# Patient Record
Sex: Female | Born: 1975 | Race: White | Hispanic: No | Smoking: Never smoker
Health system: Southern US, Community
[De-identification: ages and names within clinical notes are randomized; demographics above are authoritative.]

## PROBLEM LIST (undated history)

## (undated) DIAGNOSIS — I10 Essential (primary) hypertension: Secondary | ICD-10-CM

## (undated) DIAGNOSIS — I639 Cerebral infarction, unspecified: Secondary | ICD-10-CM

## (undated) DIAGNOSIS — F909 Attention-deficit hyperactivity disorder, unspecified type: Secondary | ICD-10-CM

## (undated) DIAGNOSIS — Z9889 Other specified postprocedural states: Secondary | ICD-10-CM

## (undated) DIAGNOSIS — N200 Calculus of kidney: Secondary | ICD-10-CM

## (undated) DIAGNOSIS — A419 Sepsis, unspecified organism: Secondary | ICD-10-CM

## (undated) DIAGNOSIS — E785 Hyperlipidemia, unspecified: Secondary | ICD-10-CM

## (undated) HISTORY — PX: SHOULDER SURGERY: SHX246

## (undated) HISTORY — DX: Cerebral infarction, unspecified: I63.9

## (undated) HISTORY — DX: Other specified postprocedural states: Z98.890

## (undated) HISTORY — DX: Sepsis, unspecified organism: A41.9

## (undated) HISTORY — PX: KNEE SURGERY: SHX244

## (undated) HISTORY — DX: Calculus of kidney: N20.0

## (undated) HISTORY — PX: LITHOTRIPSY: SUR834

## (undated) HISTORY — DX: Hyperlipidemia, unspecified: E78.5

## (undated) HISTORY — PX: OTHER SURGICAL HISTORY: SHX169

## (undated) HISTORY — DX: Essential (primary) hypertension: I10

## (undated) HISTORY — PX: HIP SURGERY: SHX245

## (undated) HISTORY — DX: Attention-deficit hyperactivity disorder, unspecified type: F90.9

## (undated) HISTORY — PX: CARDIAC SURGERY: SHX584

## (undated) HISTORY — PX: ANKLE SURGERY: SHX546

---

## 2012-03-26 ENCOUNTER — Emergency Department
Admission: EM | Admit: 2012-03-26 | Discharge: 2012-03-26 | Disposition: A | Discharge status: Home / Self Care | Payer: Managed Care, Other (non HMO) | Source: Home / Self Care | Attending: Family Medicine | Admitting: Family Medicine

## 2012-03-26 ENCOUNTER — Encounter: Payer: Self-pay | Admitting: *Deleted

## 2012-03-26 ENCOUNTER — Emergency Department (INDEPENDENT_AMBULATORY_CARE_PROVIDER_SITE_OTHER): Payer: Managed Care, Other (non HMO)

## 2012-03-26 DIAGNOSIS — R059 Cough, unspecified: Secondary | ICD-10-CM

## 2012-03-26 DIAGNOSIS — J111 Influenza due to unidentified influenza virus with other respiratory manifestations: Secondary | ICD-10-CM

## 2012-03-26 DIAGNOSIS — R05 Cough: Secondary | ICD-10-CM

## 2012-03-26 DIAGNOSIS — R509 Fever, unspecified: Secondary | ICD-10-CM

## 2012-03-26 IMAGING — CR DG CHEST 2V
2 series · 2 of 2 positions shown · non-contrast
Comparison: None.

CLINICAL DATA: Cough for 3 days, fever and chills

CHEST - 2 VIEW

[view not recorded (1 of 2)]
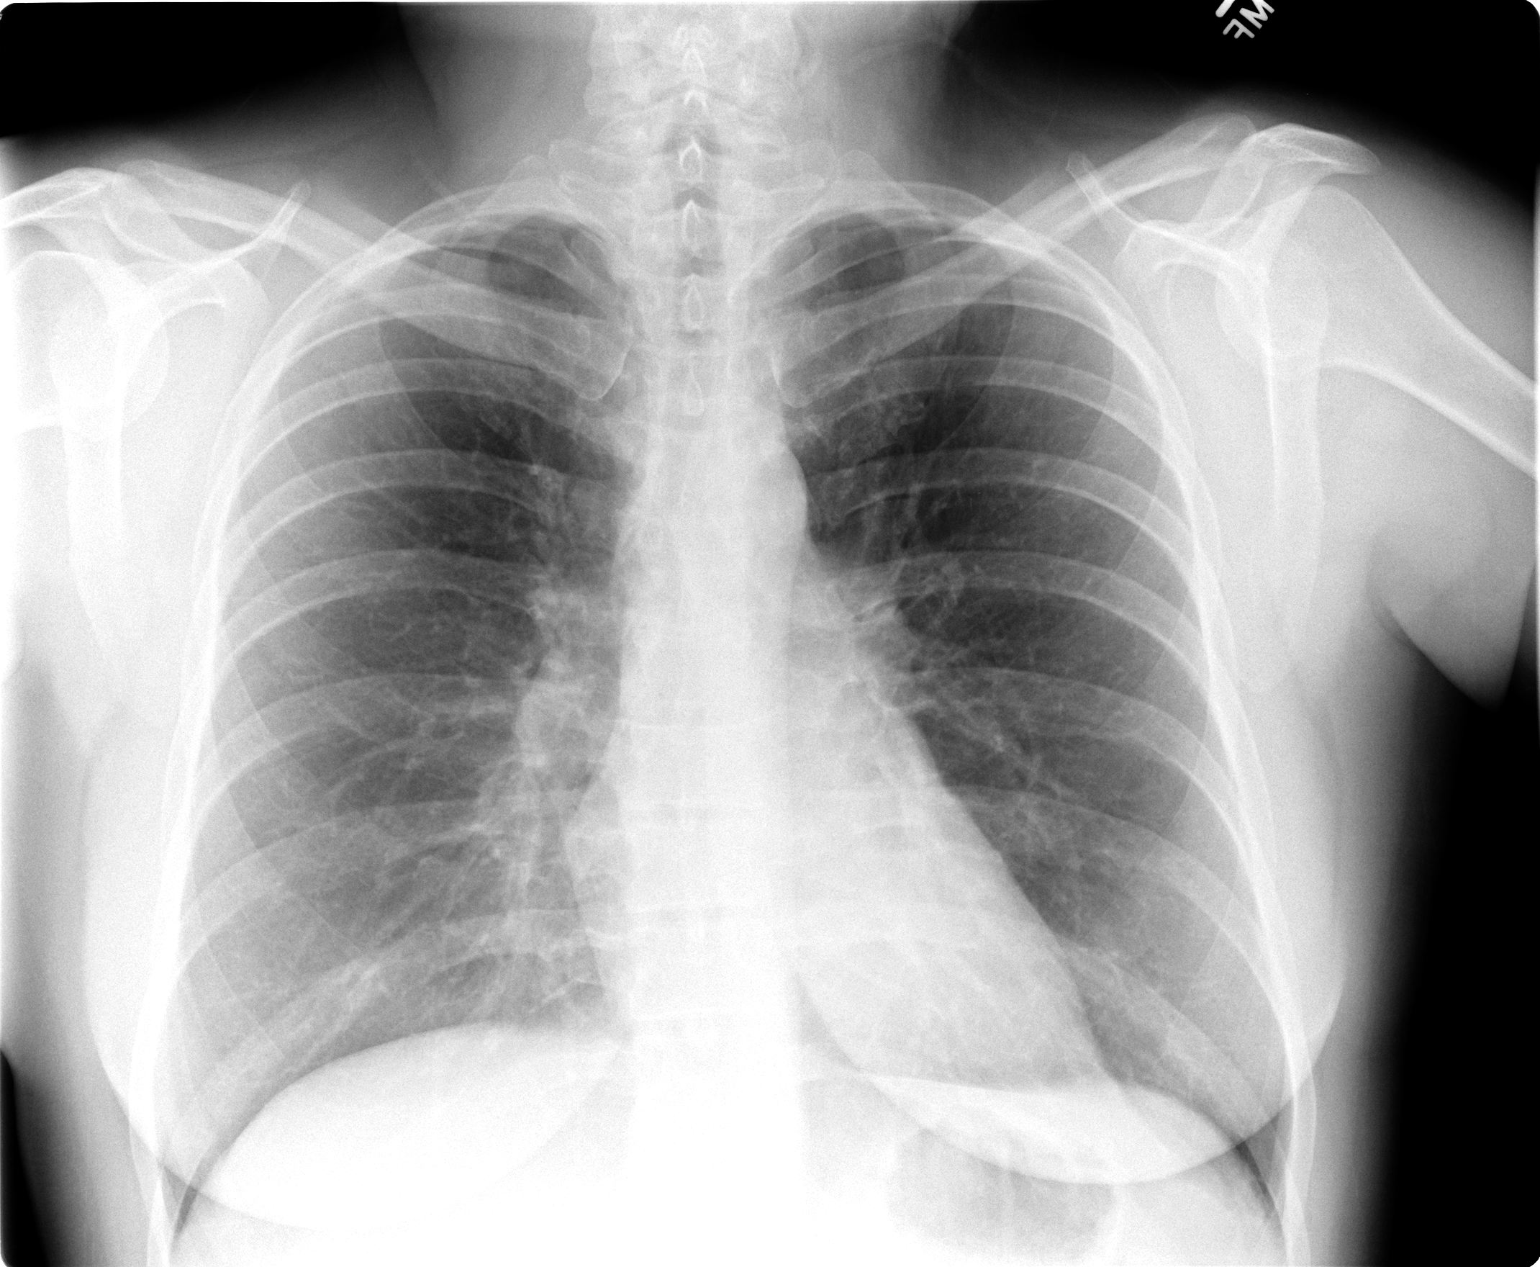

[view not recorded (2 of 2)]
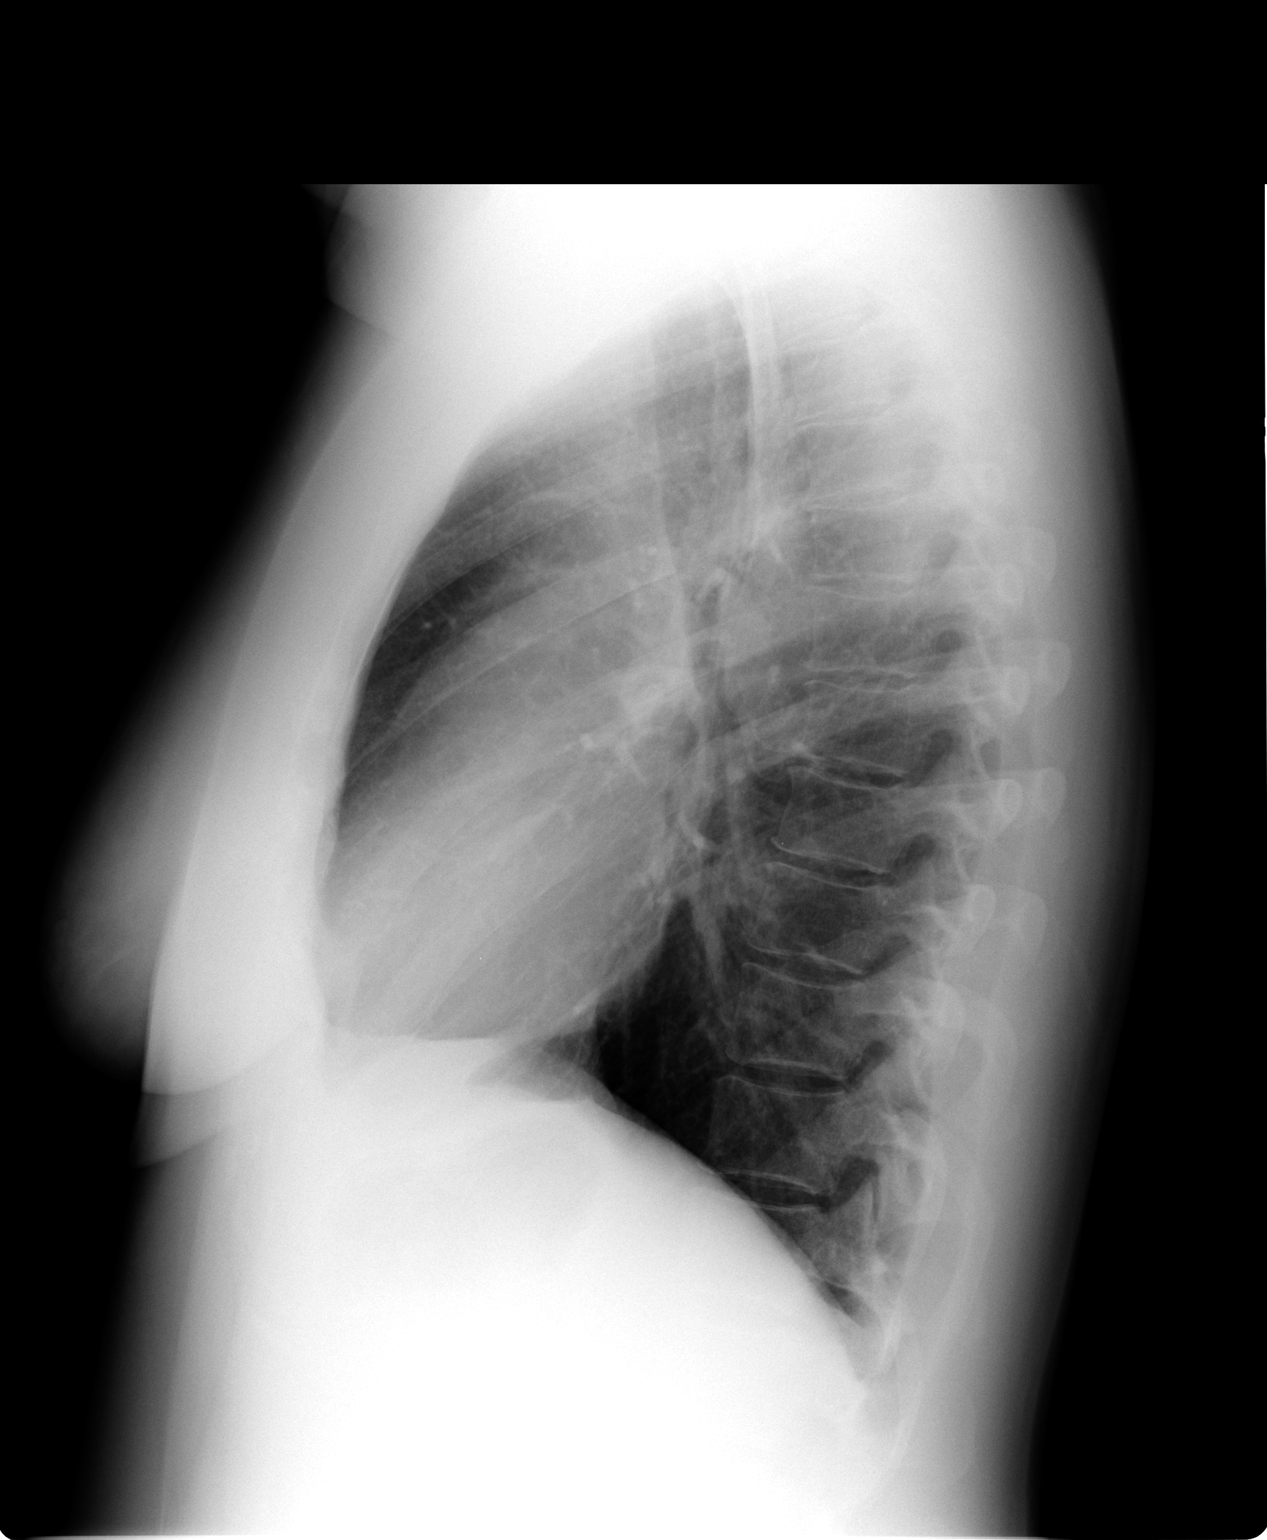

[2 of 2 positions shown; findings below may reference images not displayed]

FINDINGS: Normal cardiac silhouette and mediastinal contours.  No
focal parenchymal opacities.  No pleural effusion or pneumothorax.
No acute osseous abnormalities.
IMPRESSION: No acute cardiopulmonary disease.  Specifically, no evidence of
pneumonia.

## 2012-03-26 MED ORDER — CEFDINIR 300 MG PO CAPS: 300.0000 mg | ORAL_CAPSULE | Freq: Two times a day (BID) | ORAL | Status: DC

## 2012-03-26 MED ORDER — HYDROCOD POLST-CHLORPHEN POLST 10-8 MG/5ML PO LQCR: 5.0000 mL | Freq: Every evening | ORAL | Status: DC | PRN

## 2012-03-26 NOTE — ED Notes (Signed)
Pt c/o cough, fever, SOB x 4 days. Has tried OTC Ibuprofen and Delsym with no relief. Pt states she went to ED on Saturday night and was evaluated had CT scan, blood work, flu test and spinal tap.

## 2012-03-26 NOTE — ED Provider Notes (Signed)
History     CSN: [id number]  Arrival date & time 03/26/12  1859   First MD Initiated Contact with Patient 03/26/12 1918      Chief Complaint  Patient presents with  . Cough  . Fever  . Chills      HPI Comments: Patient states that she awoke four days ago with a headache and sinus congestion.  Later she developed body aches, chills/sweats, and tightness in her anterior chest.  She developed a cough about two days ago.  Her headache became so severe that she presented to the [facility] ER where CT head,  lumbar puncture, and flu test were negative.  She continues to have low grade fever, cough with wheezing/shortness of breath, and fatigue. She has had pneumonia three times in the past.  She has a history of exercise induced asthma.  The history is provided by the patient.    History reviewed. No pertinent past medical history.  History reviewed. No pertinent past surgical history.  Family History  Problem Relation Age of Onset  . Cancer Mother   . Stroke Father     History  Substance Use Topics  . Smoking status: Never Smoker   . Smokeless tobacco: Never Used  . Alcohol Use: No    OB History    Grav Para Term Preterm Abortions TAB SAB Ect Mult Living                  Review of Systems No sore throat + cough No pleuritic pain but has tightness in anterior chest + wheezing + nasal congestion ? post-nasal drainage No sinus pain/pressure No itchy/red eyes No earache No hemoptysis + SOB + fever, + chills No nausea No vomiting No abdominal pain + diarrhea No urinary symptoms No skin rashes + fatigue + myalgias + headache Used OTC meds without relief  Allergies  Sulfa antibiotics  Home Medications   Current Outpatient Rx  Name  Route  Sig  Dispense  Refill  . CEFDINIR 300 MG PO CAPS   Oral   Take 1 capsule (300 mg total) by mouth 2 (two) times daily.   14 capsule   0   . HYDROCOD POLST-CPM POLST ER 10-8 MG/5ML PO LQCR   Oral   Take 5  mLs by mouth at bedtime as needed.   115 mL   0     BP 117/81  Pulse 73  Temp 98.5 F (36.9 C) (Oral)  Resp 20  Ht 5\' 9"  (1.753 m)  Wt 177 lb 4 oz (80.4 kg)  BMI 26.18 kg/m2  SpO2 99%  Physical Exam Nursing notes and Vital Signs reviewed. Appearance:  Patient appears healthy, stated age, and in no acute distress Eyes:  Pupils are equal, round, and reactive to light and accomodation.  Extraocular movement is intact.  Conjunctivae are not inflamed  Ears:  Canals normal.  Tympanic membranes normal.  Nose:  Mildly congested turbinates.  No sinus tenderness.  Pharynx:  Normal Neck:  Supple.  Tender shotty posterior nodes are palpated bilaterally  Lungs:  Clear to auscultation.  Breath sounds are equal. Chest:  Distinct tenderness to palpation over the mid-sternum.   Heart:  Regular rate and rhythm without murmurs, rubs, or gallops.  Abdomen:  Nontender without masses or hepatosplenomegaly.  Bowel sounds are present.  No CVA or flank tenderness.  Extremities:  No edema.  No calf tenderness Skin:  No rash present.   ED Course  Procedures  none  Dg Chest 2 View  03/26/2012  *RADIOLOGY REPORT*  Clinical Data: Cough for 3 days, fever and chills  CHEST - 2 VIEW  Comparison: None.  Findings: Normal cardiac silhouette and mediastinal contours.  No focal parenchymal opacities.  No pleural effusion or pneumothorax. No acute osseous abnormalities.  IMPRESSION: No acute cardiopulmonary disease.  Specifically, no evidence of pneumonia.   Original Report Authenticated By: Tacey Ruiz, MD      1. Influenza-like illness       MDM  With a history of several episodes of pneumonia, will begin empiric Omnicef.  Tussionex for night-time cough. Take Mucinex D (guaifenesin with decongestant) twice daily for congestion.  Increase fluid intake, rest. May use Afrin nasal spray (or generic oxymetazoline) twice daily for about 5 days.  Also recommend using saline nasal spray several times daily and  saline nasal irrigation (AYR is a common brand) Stop all antihistamines for now, and other non-prescription cough/cold preparations. May take Ibuprofen 200mg , 4 tabs every 8 hours with food for body aches, headache, fever, etc. Recommend a flu shot when well.  Follow-up with family doctor if not improving 7 to 10 days.         Lattie Haw, MD 03/26/12 2027

## 2014-12-22 DIAGNOSIS — M797 Fibromyalgia: Secondary | ICD-10-CM | POA: Insufficient documentation

## 2015-05-12 DIAGNOSIS — F419 Anxiety disorder, unspecified: Secondary | ICD-10-CM | POA: Diagnosis present

## 2016-05-27 DIAGNOSIS — I639 Cerebral infarction, unspecified: Secondary | ICD-10-CM | POA: Insufficient documentation

## 2016-07-27 DIAGNOSIS — E785 Hyperlipidemia, unspecified: Secondary | ICD-10-CM | POA: Insufficient documentation

## 2019-06-07 DIAGNOSIS — F909 Attention-deficit hyperactivity disorder, unspecified type: Secondary | ICD-10-CM | POA: Insufficient documentation

## 2020-06-01 ENCOUNTER — Other Ambulatory Visit: Payer: Self-pay | Admitting: Family Medicine

## 2020-06-01 DIAGNOSIS — M4807 Spinal stenosis, lumbosacral region: Secondary | ICD-10-CM

## 2020-06-01 DIAGNOSIS — G8929 Other chronic pain: Secondary | ICD-10-CM

## 2020-06-01 DIAGNOSIS — M25552 Pain in left hip: Secondary | ICD-10-CM

## 2020-06-20 ENCOUNTER — Ambulatory Visit
Admission: RE | Admit: 2020-06-20 | Discharge: 2020-06-20 | Disposition: A | Discharge status: Home / Self Care | Payer: Managed Care, Other (non HMO) | Source: Ambulatory Visit | Attending: Family Medicine | Admitting: Family Medicine

## 2020-06-20 ENCOUNTER — Other Ambulatory Visit: Payer: Self-pay

## 2020-06-20 DIAGNOSIS — M25552 Pain in left hip: Secondary | ICD-10-CM

## 2020-06-20 DIAGNOSIS — G8929 Other chronic pain: Secondary | ICD-10-CM

## 2020-06-20 DIAGNOSIS — M4807 Spinal stenosis, lumbosacral region: Secondary | ICD-10-CM

## 2020-06-20 IMAGING — MR MR HIP*L* W/O CM
5 of 6 series · 29 of 40 positions shown · non-contrast
Comparison: None.

CLINICAL DATA: Chronic left hip pain

EXAM:
MR OF THE LEFT HIP WITHOUT CONTRAST
TECHNIQUE: Multiplanar, multisequence MR imaging was performed. No intravenous
contrast was administered.

[Series 4: T2 fat-sat · coronal · 4.0mm · 0.74mm/px · 6 of 24 slices shown (1 of 3)]
[im 1/24]
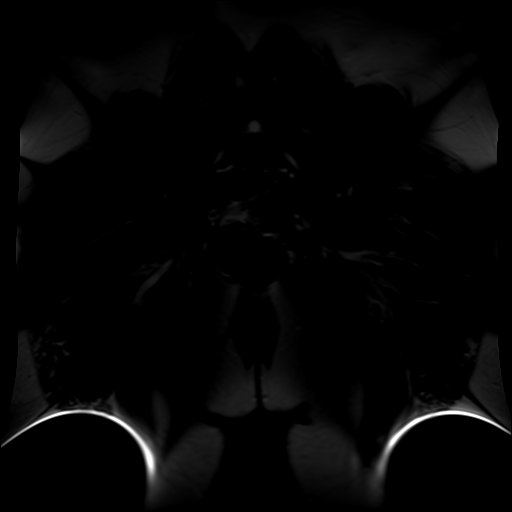
[im 5/24]
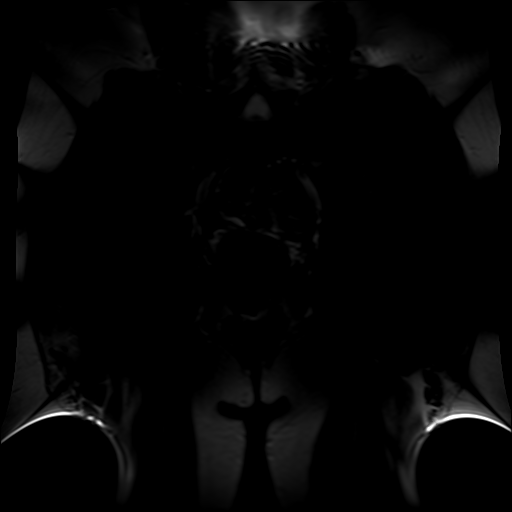
[im 10/24]
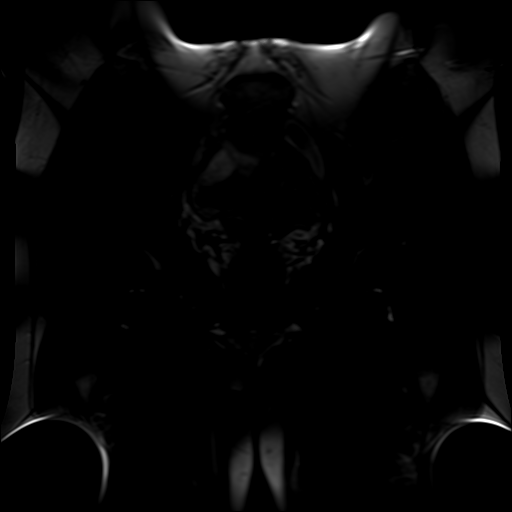
[im 14/24]
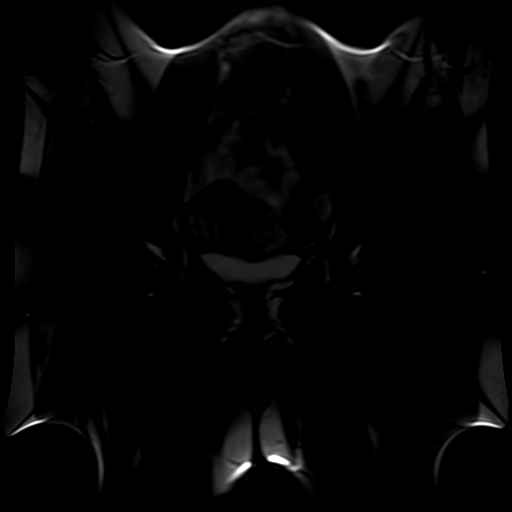
[im 19/24]
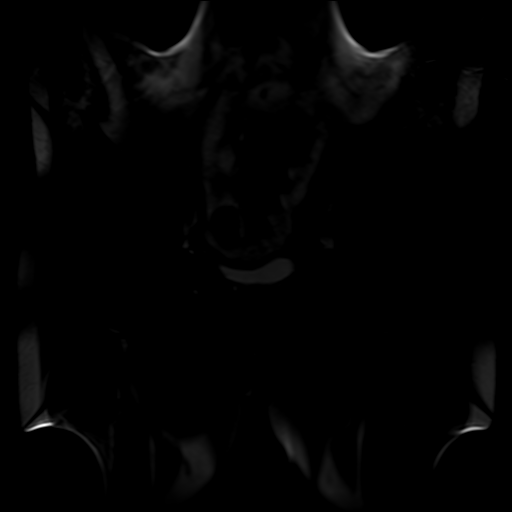
[im 24/24]
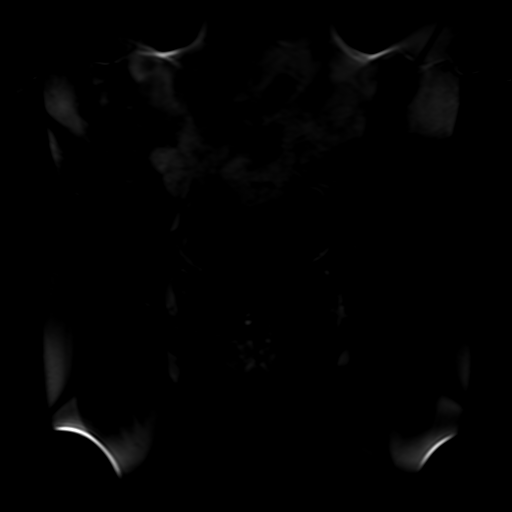

[Series 5: T2 fat-sat · axial · 4.0mm · 0.70mm/px · z∈[-144,+16]mm · 9 of 33 slices shown (2 of 3)]
[im 1/33]
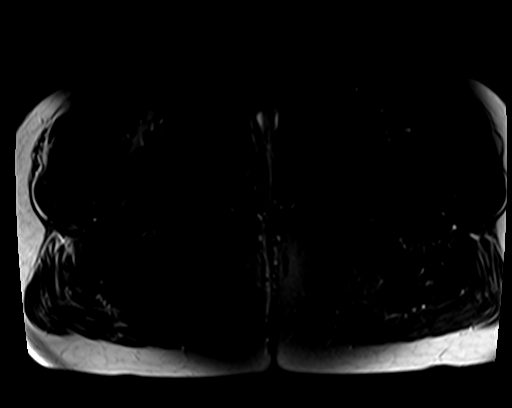
[im 5/33]
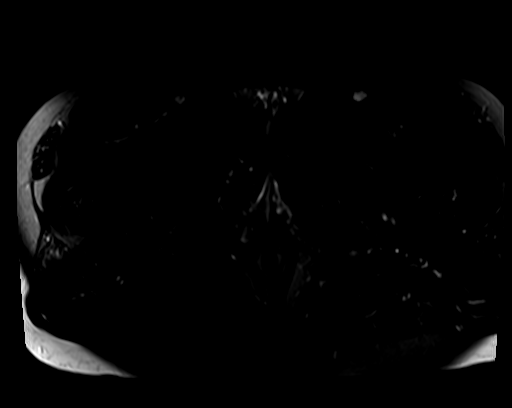
[im 9/33]
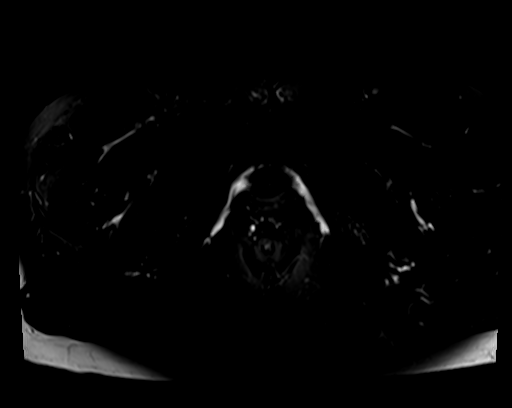
[im 13/33]
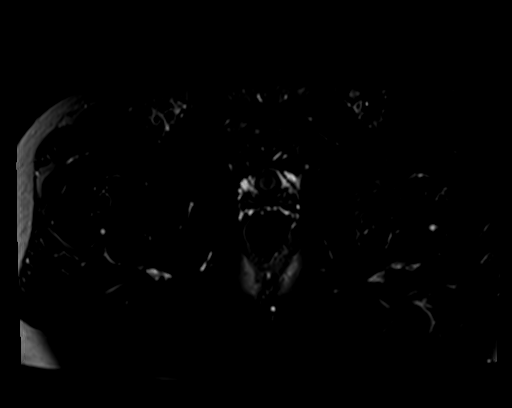
[im 17/33]
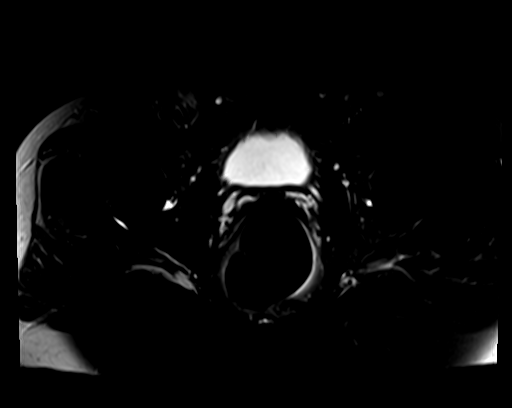
[im 21/33]
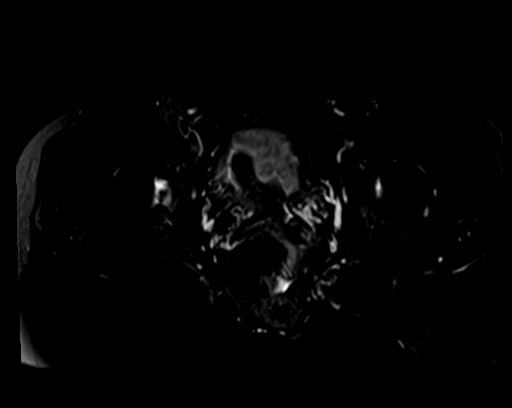
[im 25/33]
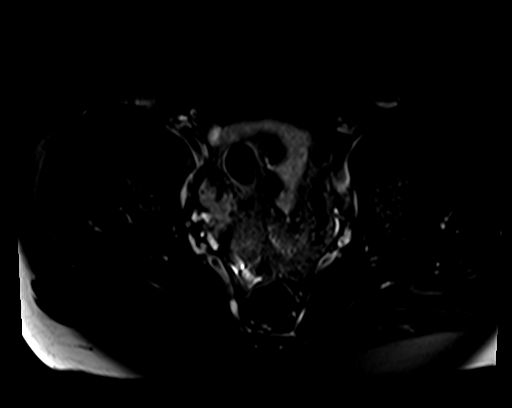
[im 29/33]
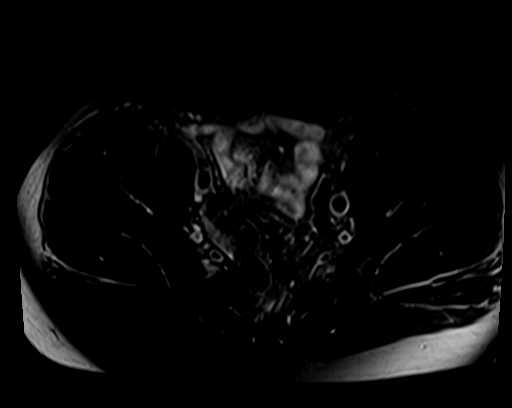
[im 33/33]
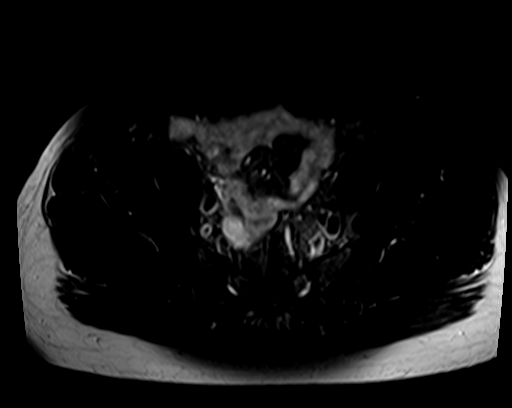

[Series 6: PD fat-sat · sagittal · 4.0mm · 0.70mm/px · 6 of 22 slices shown (1 of 2)]
[im 1/22]
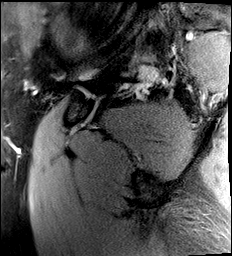
[im 5/22]
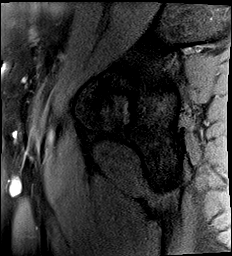
[im 9/22]
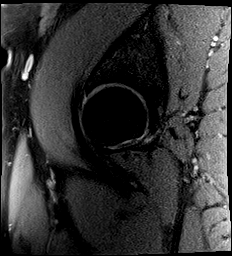
[im 13/22]
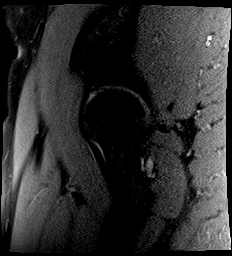
[im 17/22]
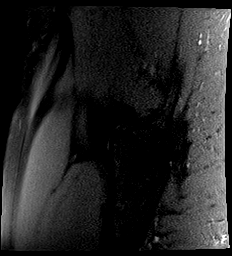
[im 22/22]
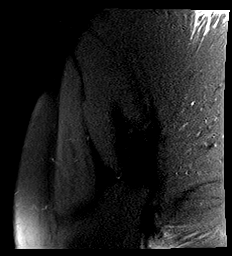

[Series 7: PD fat-sat · coronal · 4.0mm · 0.70mm/px · 5 of 19 slices shown (2 of 2)]
[im 1/19]
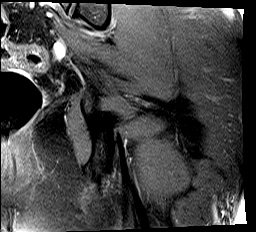
[im 5/19]
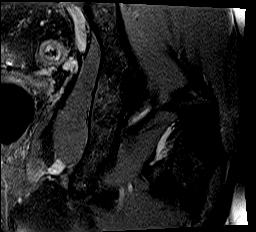
[im 10/19]
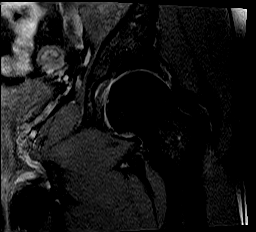
[im 14/19]
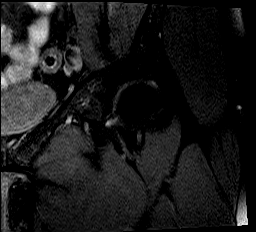
[im 19/19]
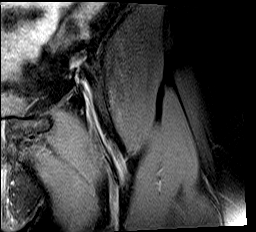

[Series 8: T2 fat-sat · axial · 4.0mm · 0.35mm/px · z∈[-133,-93]mm · 3 of 27 slices shown (3 of 3)]
[im 1/27]
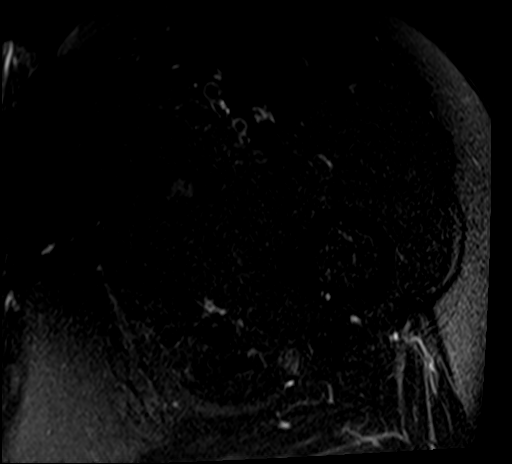
[im 5/27]
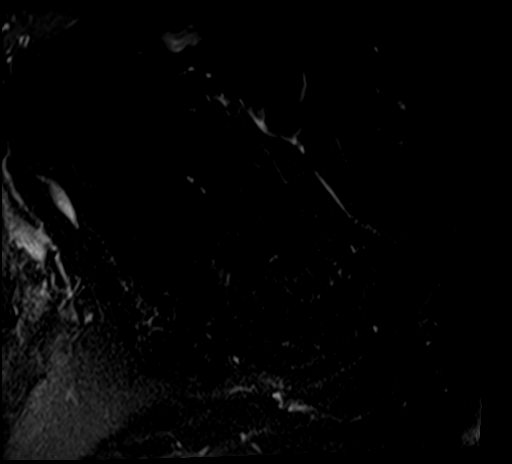
[im 9/27]
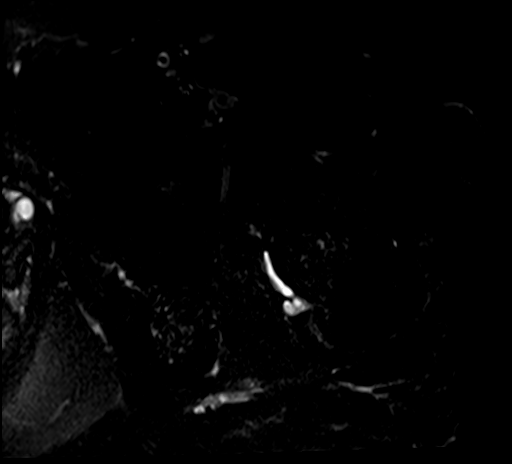

[29 of 40 positions shown; findings below may reference images not displayed]

FINDINGS: Bones: No acute fracture. No dislocation. No femoral head avascular
necrosis. Pelvic bony ring intact. Minimal degenerative changes of
the bilateral sacroiliac joints. No evidence of sacroiliitis. No SI
joint effusion. Minimal degenerative changes of the pubic symphysis.
No marrow edema. No suspicious marrow replacing lesion.

Articular cartilage and labrum

Articular cartilage: Mild chondral thinning along the superior
aspect of the left hip joint. No subchondral signal changes.

Labrum: Degenerative tearing of the superior labrum. No paralabral
cyst.

Joint or bursal effusion

Joint effusion:  None.

Bursae: No abnormal bursal fluid collection.

Muscles and tendons

Muscles and tendons: The gluteal, hamstring, iliopsoas, rectus
femoris, and adductor tendons appear intact without tear or
significant tendinosis. Normal muscle bulk and signal intensity
without edema, atrophy, or fatty infiltration.

Other findings

Miscellaneous: No soft tissue edema or fluid collection. No inguinal
lymphadenopathy. No acute findings are seen within the pelvis.
IMPRESSION: 1. Mild left hip osteoarthritis with degenerative tearing of the
superior labrum.
2. No acute osseous abnormality.

## 2020-06-20 IMAGING — MR MR LUMBAR SPINE W/O CM
4 of 5 series · 26 of 48 positions shown · non-contrast
Comparison: None.

CLINICAL DATA: Low back pain radiating to the left hip

EXAM:
MRI LUMBAR SPINE WITHOUT CONTRAST
TECHNIQUE: Multiplanar, multisequence MR imaging of the lumbar spine was
performed. No intravenous contrast was administered.

[Series 3: T2 · sagittal · 4.0mm · 0.53mm/px · 6 of 16 slices shown (1 of 2)]
[im 1/16]
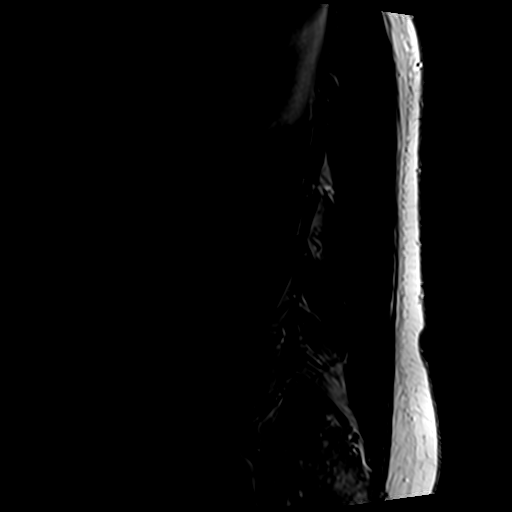
[im 4/16]
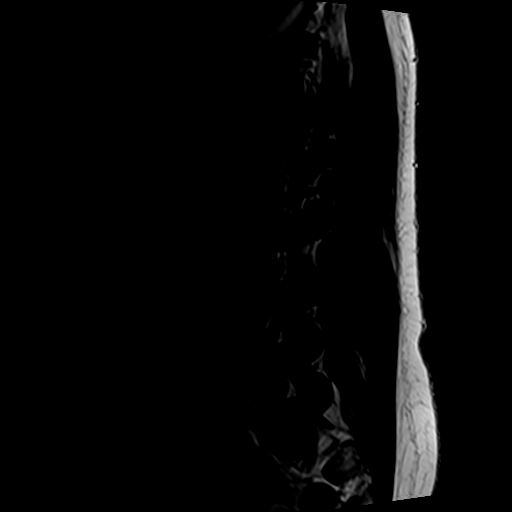
[im 7/16]
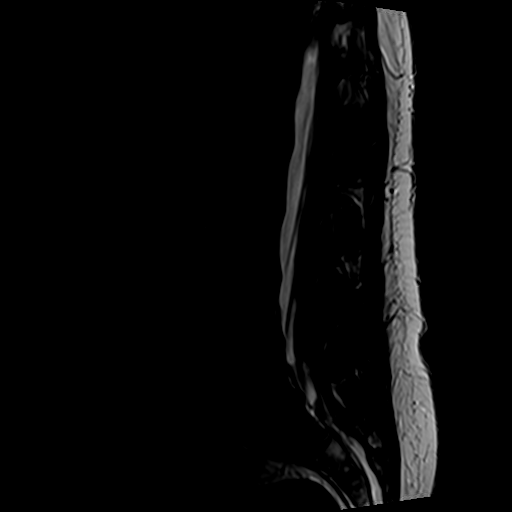
[im 10/16]
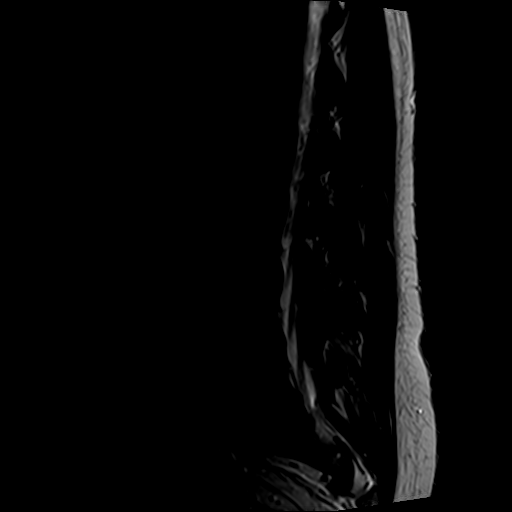
[im 13/16]
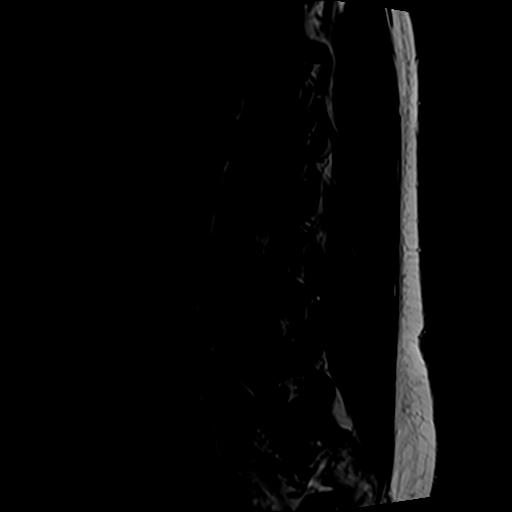
[im 16/16]
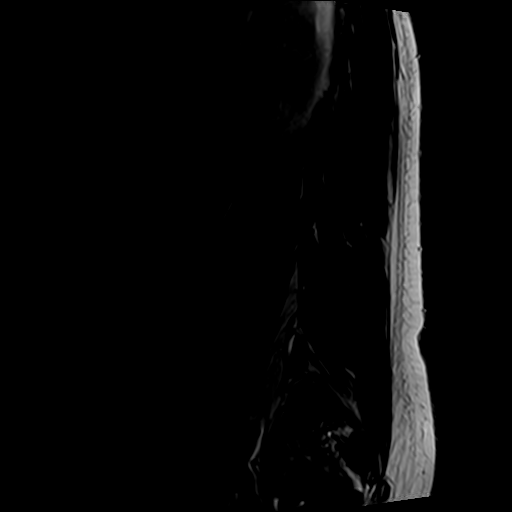

[Series 5: T1 · sagittal · 4.0mm · 0.53mm/px · 6 of 16 slices shown (1 of 2)]
[im 1/16]
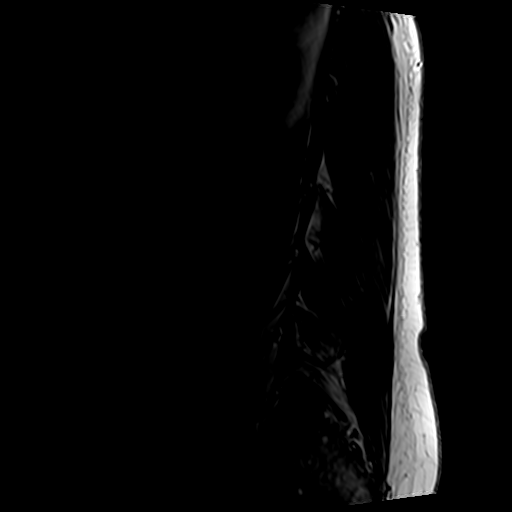
[im 4/16]
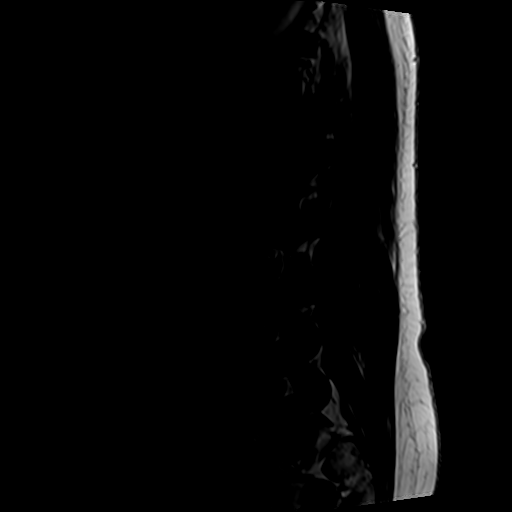
[im 7/16]
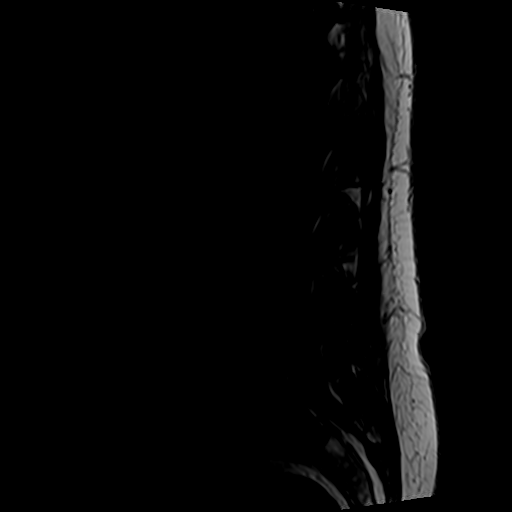
[im 10/16]
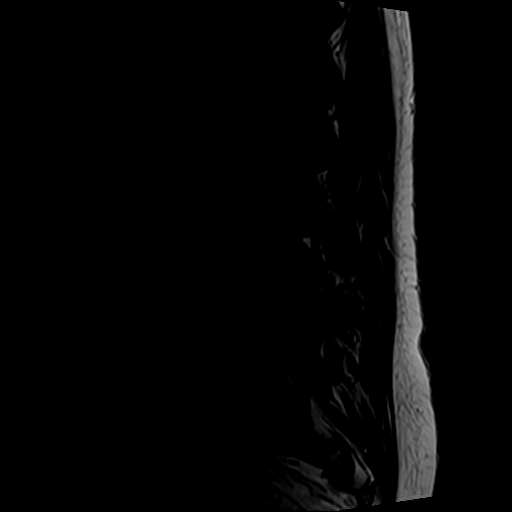
[im 13/16]
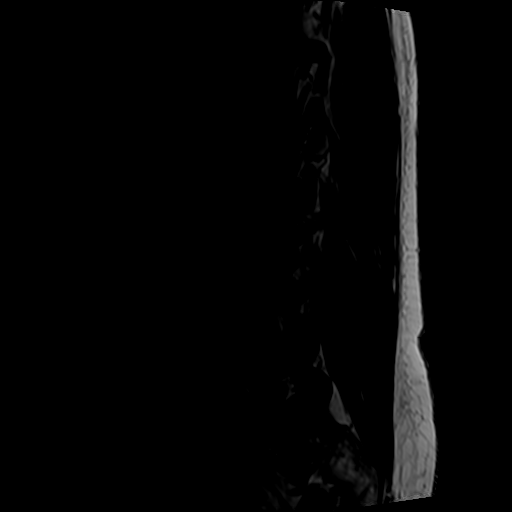
[im 16/16]
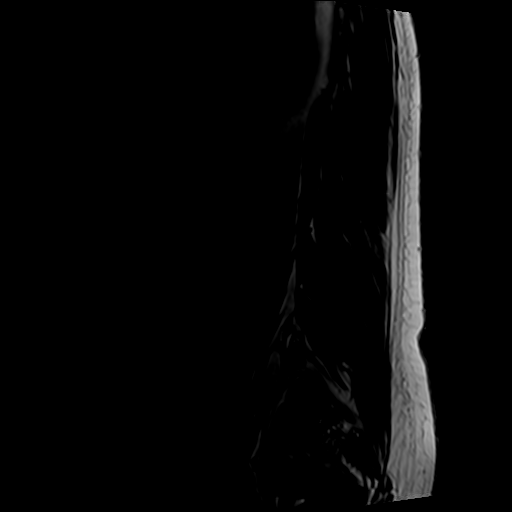

[Series 6: T2 · axial · 4.0mm · 0.70mm/px · z∈[-147,+71]mm · 9 of 40 slices shown (2 of 2)]
[im 1/40]
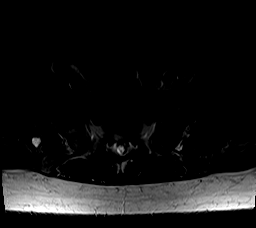
[im 6/40]
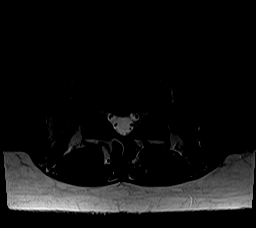
[im 12/40]
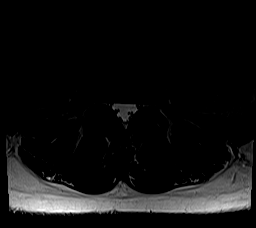
[im 17/40]
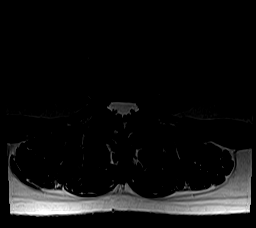
[im 20/40]
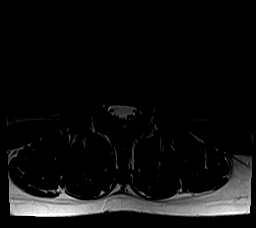
[im 23/40]
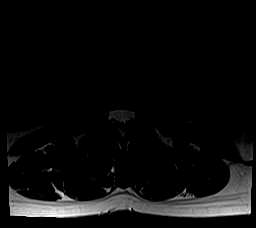
[im 28/40]
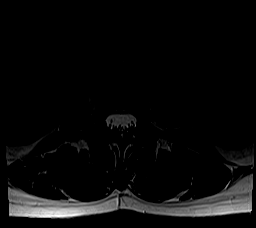
[im 34/40]
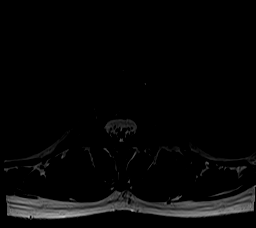
[im 40/40]
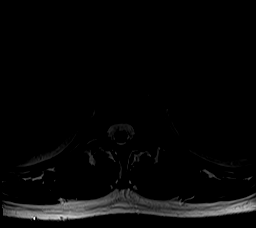

[Series 7: T1 · axial · 4.0mm · 0.35mm/px · z∈[-147,+39]mm · 5 of 40 slices shown (2 of 2)]
[im 1/40]
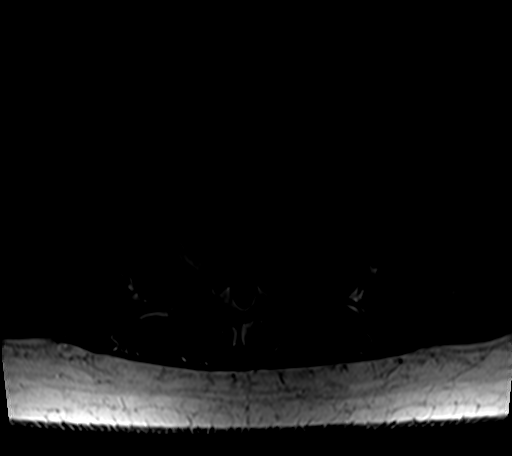
[im 6/40]
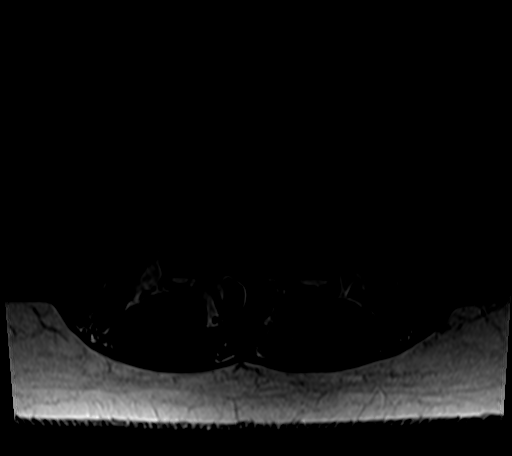
[im 12/40]
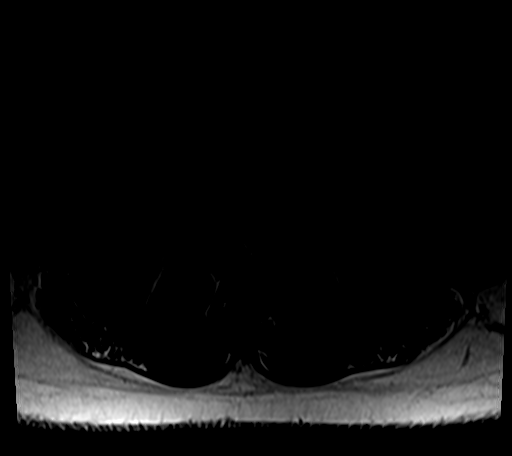
[im 20/40]
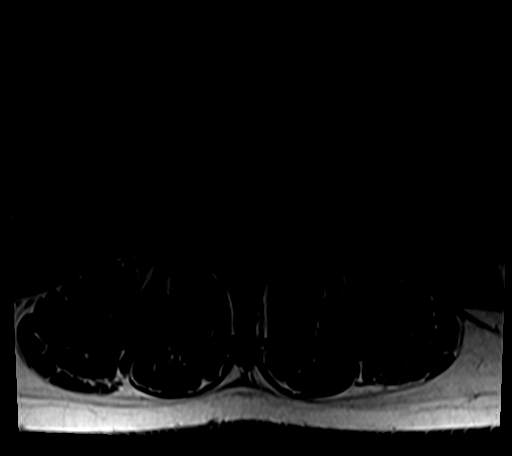
[im 34/40]
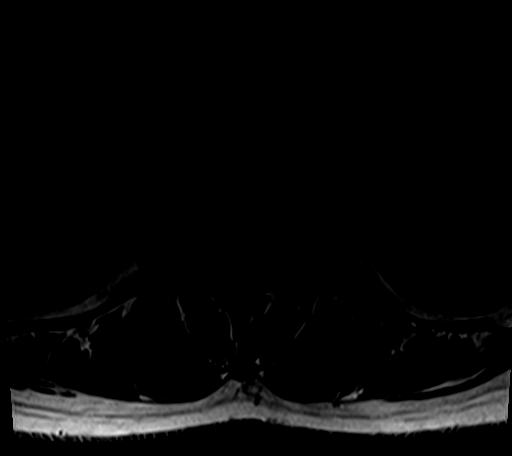

[26 of 48 positions shown; findings below may reference images not displayed]

FINDINGS: Segmentation:  Standard.

Alignment:  Physiologic.

Vertebrae:  No fracture, evidence of discitis, or bone lesion.

Conus medullaris and cauda equina: Conus extends to the L1 level.
Conus and cauda equina appear normal.

Paraspinal and other soft tissues: Negative.

Disc levels:

T12-L1: No significant disc protrusion, foraminal stenosis, or canal
stenosis.

L1-L2: No significant disc protrusion, foraminal stenosis, or canal
stenosis.

L2-L3: No significant disc protrusion, foraminal stenosis, or canal
stenosis.

L3-L4: No significant disc protrusion, foraminal stenosis, or canal
stenosis.

L4-L5: No significant disc protrusion, foraminal stenosis, or canal
stenosis.

L5-S1: Disc desiccation with disc height loss and small right
paracentral disc protrusion. Posterior annular fissure. No foraminal
or canal stenosis.
IMPRESSION: Mild degenerative disc disease at L5-S1 without foraminal or canal
stenosis.

## 2021-05-31 ENCOUNTER — Other Ambulatory Visit: Payer: Self-pay | Admitting: Family Medicine

## 2021-05-31 DIAGNOSIS — I10 Essential (primary) hypertension: Secondary | ICD-10-CM

## 2021-05-31 DIAGNOSIS — I701 Atherosclerosis of renal artery: Secondary | ICD-10-CM

## 2021-05-31 DIAGNOSIS — R0609 Other forms of dyspnea: Secondary | ICD-10-CM

## 2021-05-31 NOTE — Progress Notes (Deleted)
{  Select_TRH_Note:[id number]} 

## 2021-06-03 ENCOUNTER — Ambulatory Visit (HOSPITAL_COMMUNITY): Admission: RE | Admit: 2021-06-03 | Payer: 59 | Source: Ambulatory Visit

## 2021-06-09 ENCOUNTER — Other Ambulatory Visit: Payer: Self-pay

## 2021-06-09 ENCOUNTER — Ambulatory Visit
Admission: RE | Admit: 2021-06-09 | Discharge: 2021-06-09 | Disposition: A | Discharge status: Home / Self Care | Payer: 59 | Source: Ambulatory Visit | Attending: Family Medicine | Admitting: Family Medicine

## 2021-06-09 DIAGNOSIS — I10 Essential (primary) hypertension: Secondary | ICD-10-CM

## 2021-06-09 DIAGNOSIS — I701 Atherosclerosis of renal artery: Secondary | ICD-10-CM

## 2021-06-09 IMAGING — US US RENAL ARTERY STENOSIS
1 series · 13 of 25 positions shown · non-contrast
Comparison: None.

CLINICAL DATA: Uncontrolled hypertension. Evaluate for renal artery
stenosis.

EXAM:
RENAL/URINARY TRACT ULTRASOUND
RENAL DUPLEX DOPPLER ULTRASOUND

[Series 1: us renal artery stenosis · 0.26mm/px · 13 of 106 slices shown]
[im 1/106]
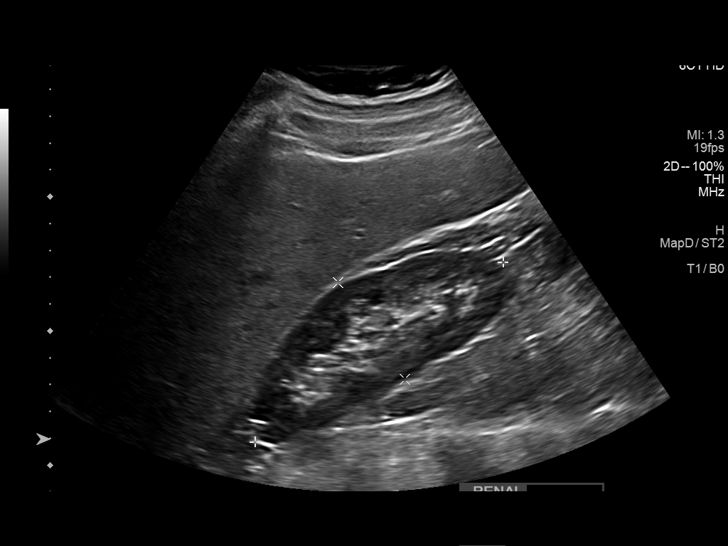
[im 9/106]
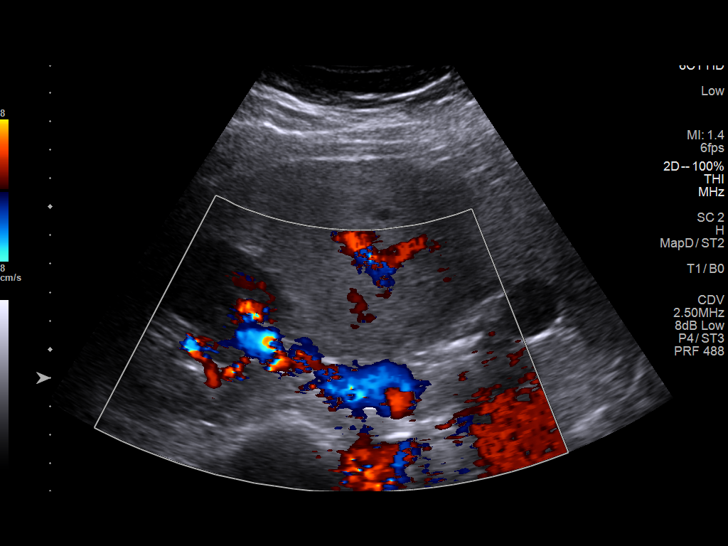
[im 18/106]
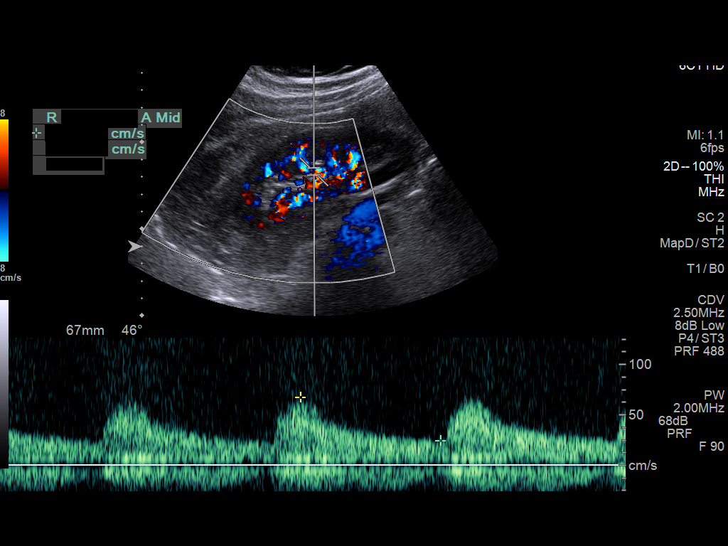
[im 27/106]
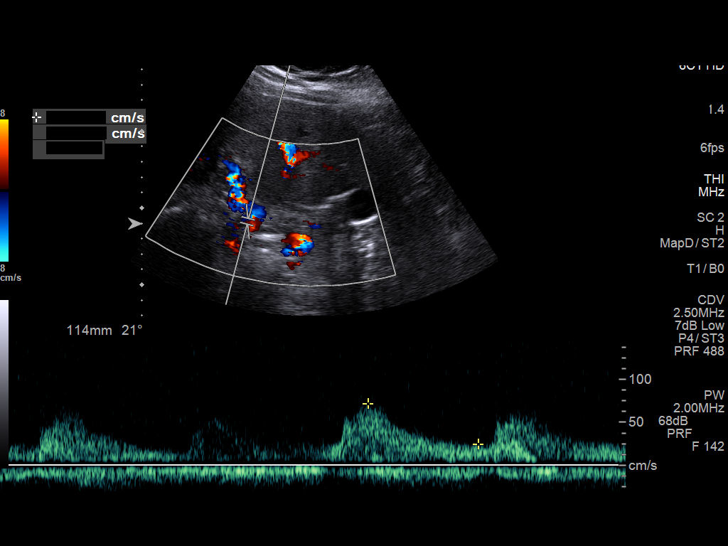
[im 36/106]
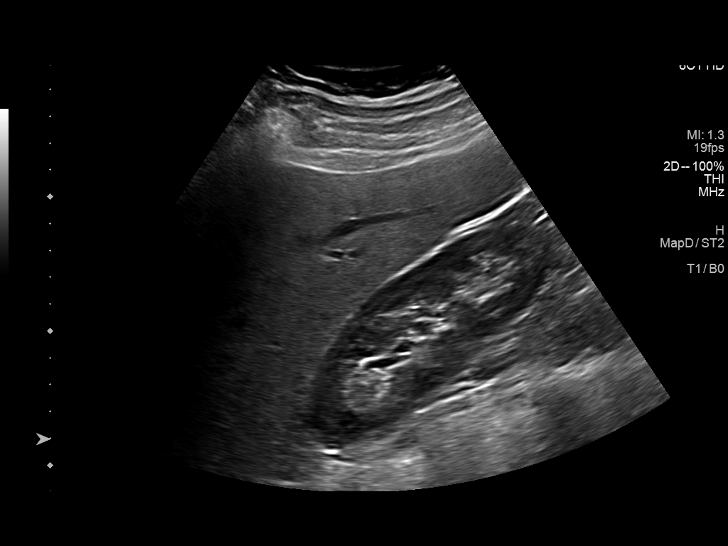
[im 44/106]
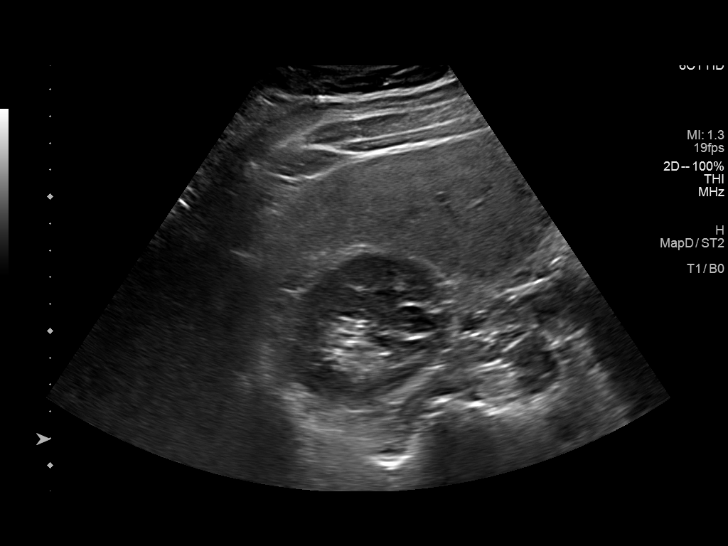
[im 53/106]
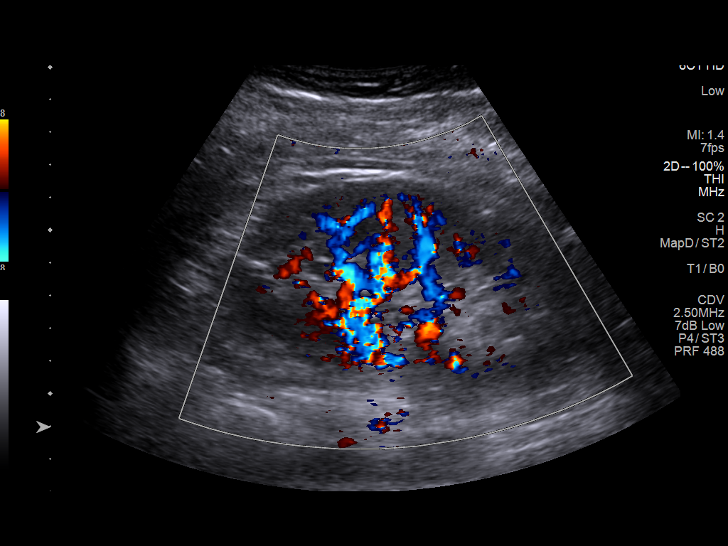
[im 62/106]
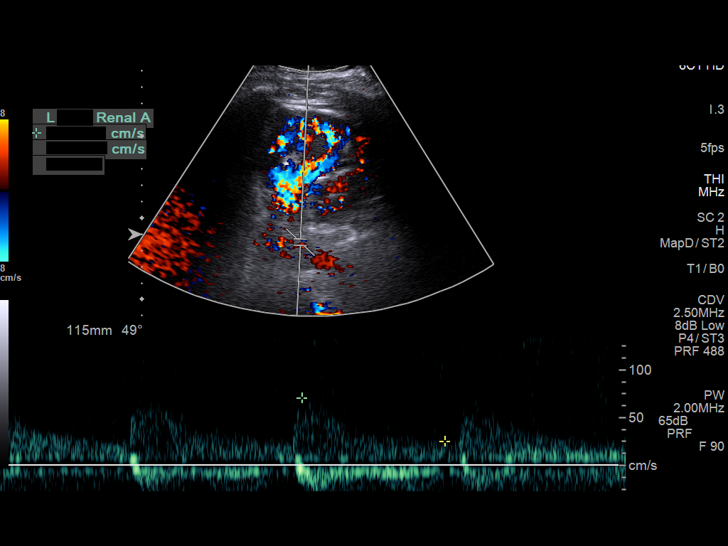
[im 71/106]
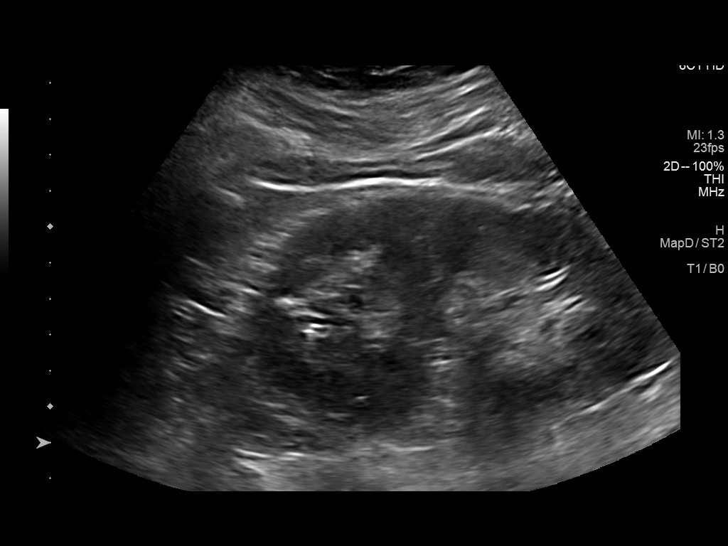
[im 79/106]
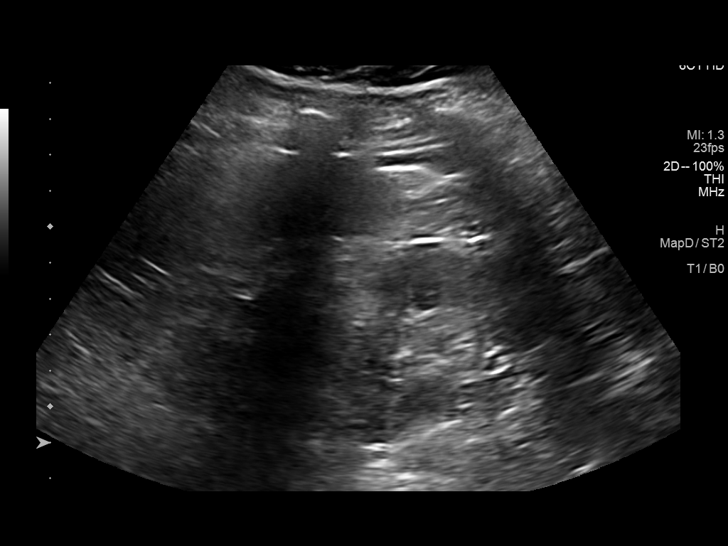
[im 88/106]
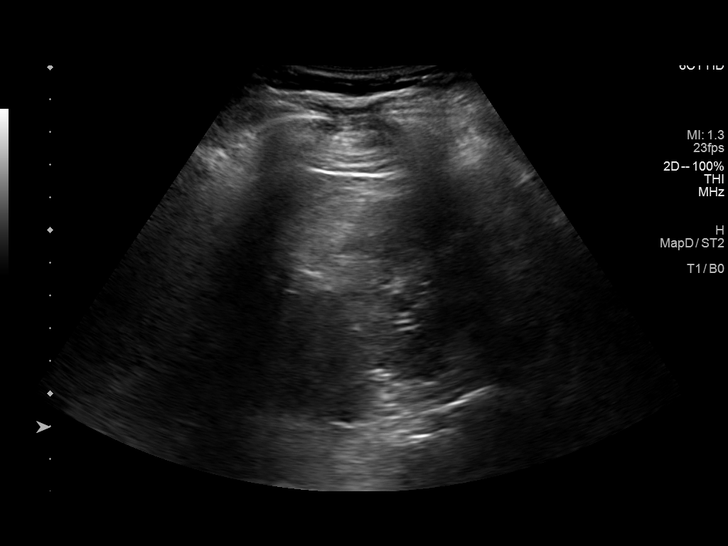
[im 97/106]
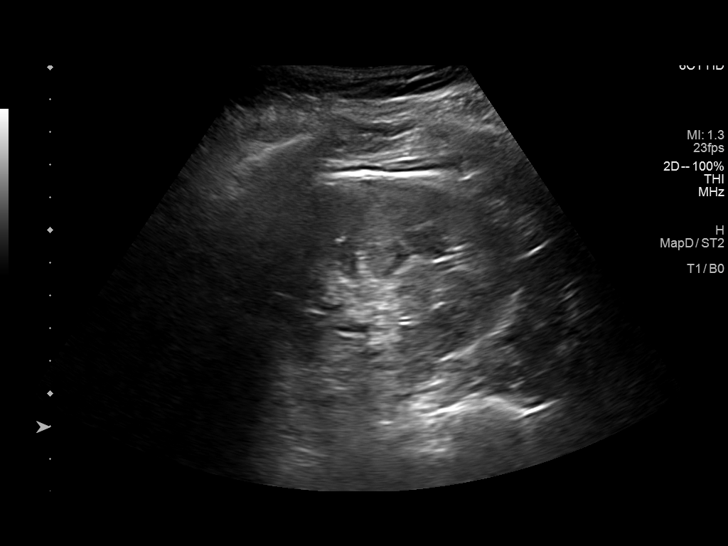
[im 106/106]
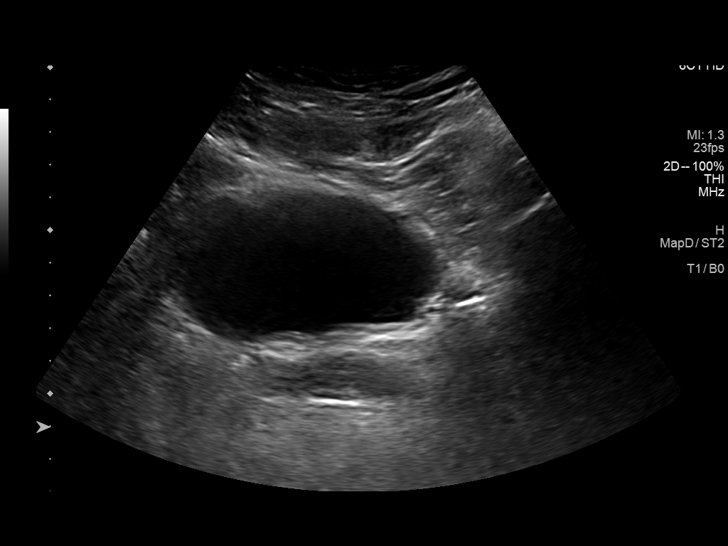

[13 of 25 positions shown; findings below may reference images not displayed]

FINDINGS: Right Kidney:

Normal cortical thickness, echogenicity and size, measuring 11.4 cm
in length. No focal renal lesions. No echogenic renal stones. No
urinary obstruction.

Left Kidney:

Normal cortical thickness, echogenicity and size, measuring 12.5 cm
in length. Questionable approximately 2.2 x 2.0 x 1.6 cm
hypo/isoechoic lesion involving the superior pole the left kidney
(images 74 and 81). No echogenic renal stones. No urinary
obstruction.

Bladder:  Appears normal given degree of distention.

_________________________________________________________

RENAL DUPLEX ULTRASOUND

Right Renal Artery Velocities: Note is made of 2 right-sided renal
arteries

Origin:  108 cm/sec  / 119 cm/sec

Mid:  351 cm/sec / 35 cm/sec

Hilum:  303 cm/sec / 94 cm/sec

Interlobar:  88 cm/sec

Arcuate:  33 cm/sec

Left Renal Artery Velocities:

Origin:  71 cm/sec

Mid:  139 cm/sec

Hilum:  134 cm/sec

Interlobar:  70 cm/sec

Arcuate:  31 cm/sec

Aortic Velocity:  97 cm/sec

Right Renal-Aortic Ratios:

Origin: 1.1 /

Mid: 3.6 /

Hilum: 3.1 /

Interlobar:

Arcuate:

Left Renal-Aortic Ratios:

Origin:

Mid:

Hilum:

Interlobar:

Arcuate:

Elevated peak systolic velocities and abnormal renal artery/aortic
ratios are demonstrated in one of the duplicated right renal
arteries.

Normal velocities and low resistance waveforms demonstrated
throughout the additional duplicated right renal artery as well as
the left renal artery. The bilateral renal veins appear patent.

Normal caliber the abdominal aorta.

There is diffuse increased echogenicity of the hepatic parenchyma
with potential mild nodularity of the hepatic contour
(representative image 39), findings suggestive of hepatic steatosis.
IMPRESSION: 1. Suspected hemodynamically significant narrowing involving one of
the duplicated right renal arteries, however this finding is without
associated asymmetric renal atrophy. Further evaluation with CTA of
the abdomen pelvis could be performed as indicated.
2. Indeterminate 2.2 cm right-sided renal lesion/pseudolesion.
Again, this could be further evaluated with abdominal CT as
indicated.
3. Suspected hepatic steatosis.  Correlation with LFTs is advised.

## 2021-06-30 ENCOUNTER — Other Ambulatory Visit: Payer: Self-pay | Admitting: Family Medicine

## 2021-06-30 DIAGNOSIS — I701 Atherosclerosis of renal artery: Secondary | ICD-10-CM

## 2021-06-30 DIAGNOSIS — I150 Renovascular hypertension: Secondary | ICD-10-CM

## 2021-06-30 NOTE — Progress Notes (Unsigned)
t{Select_TRH_Note:26780} ?

## 2021-07-06 ENCOUNTER — Ambulatory Visit (HOSPITAL_COMMUNITY)
Admission: RE | Admit: 2021-07-06 | Discharge: 2021-07-06 | Disposition: A | Discharge status: Home / Self Care | Payer: 59 | Source: Ambulatory Visit | Attending: Family Medicine | Admitting: Family Medicine

## 2021-07-06 DIAGNOSIS — I160 Hypertensive urgency: Secondary | ICD-10-CM | POA: Diagnosis not present

## 2021-07-06 DIAGNOSIS — I517 Cardiomegaly: Secondary | ICD-10-CM | POA: Diagnosis not present

## 2021-07-06 DIAGNOSIS — R0609 Other forms of dyspnea: Secondary | ICD-10-CM | POA: Insufficient documentation

## 2021-07-06 DIAGNOSIS — I7121 Aneurysm of the ascending aorta, without rupture: Secondary | ICD-10-CM | POA: Diagnosis not present

## 2021-07-06 LAB — ECHOCARDIOGRAM COMPLETE
Area-P 1/2: 2.39 cm2
Calc EF: 60.6 %
S' Lateral: 2.6 cm
Single Plane A2C EF: 67.8 %
Single Plane A4C EF: 57.1 %

## 2021-07-07 ENCOUNTER — Ambulatory Visit
Admission: RE | Admit: 2021-07-07 | Discharge: 2021-07-07 | Disposition: A | Discharge status: Home / Self Care | Payer: 59 | Source: Ambulatory Visit | Attending: Family Medicine | Admitting: Family Medicine

## 2021-07-07 DIAGNOSIS — I701 Atherosclerosis of renal artery: Secondary | ICD-10-CM

## 2021-07-07 DIAGNOSIS — I150 Renovascular hypertension: Secondary | ICD-10-CM

## 2021-07-07 IMAGING — CT CT CTA ABD/PEL W/CM AND/OR W/O CM
2 of 10 series · 13 of 46 positions shown, 17 images · IV contrast (agent unspecified)
Comparison: Ultrasound [DATE]

CLINICAL DATA: Abnormal renal artery Doppler ultrasound.
Unexplained hypertension, right-sided abdominal pain, nausea

EXAM:
CTA ABDOMEN AND PELVIS WITHOUT AND WITH CONTRAST
TECHNIQUE: Multidetector CT imaging of the abdomen and pelvis was performed
using the standard protocol during bolus administration of
intravenous contrast. Multiplanar reconstructed images and MIPs were
obtained and reviewed to evaluate the vascular anatomy.

[Series 9: cta arterial 2.00 bv36 s3 renal cor art · coronal · arterial · 0.56mm/px · 2 of 142 slices shown]
[im 48/142  soft-tissue]
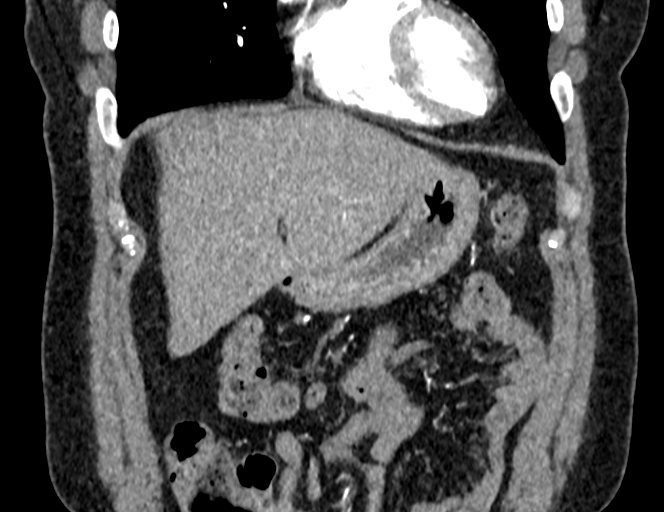
[im 95/142  soft-tissue]
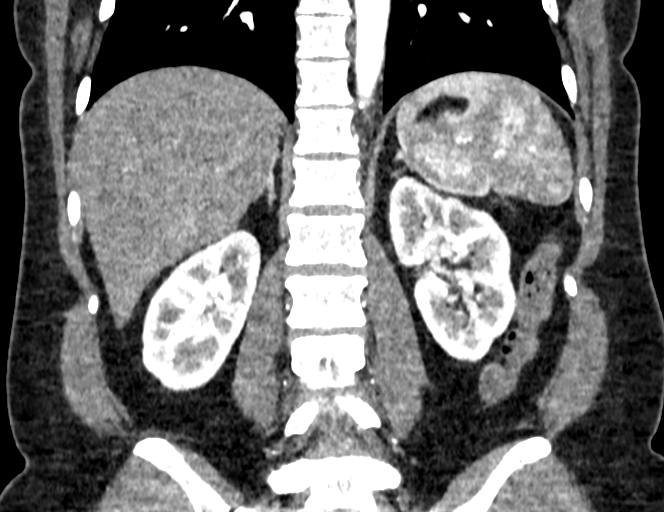

[Series 17: cta renal venous 1.00 bv36 s3 thins · axial · portal-venous · 0.83mm/px · z∈[+1264,+1673]mm · 11 of 487 slices shown, 15 images]
[im 52/487  soft-tissue]
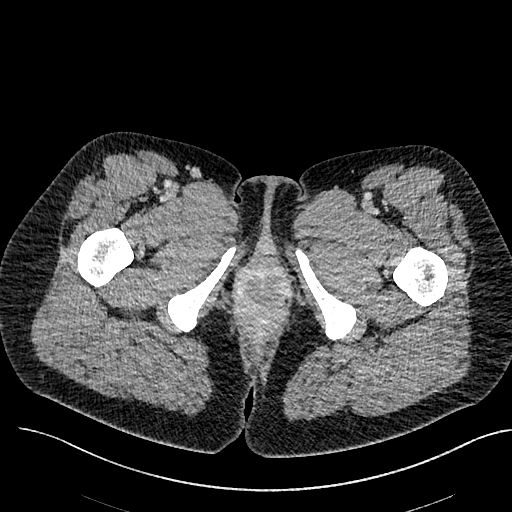
[im 52/487  bone]
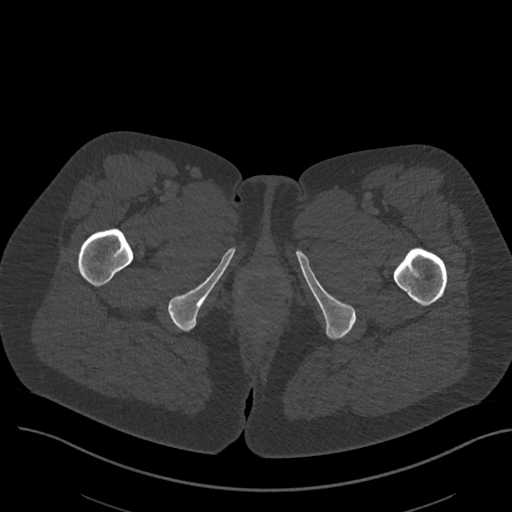
[im 103/487  soft-tissue]
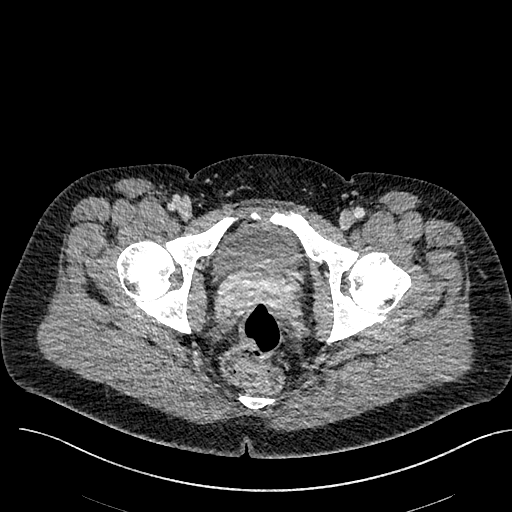
[im 154/487  soft-tissue]
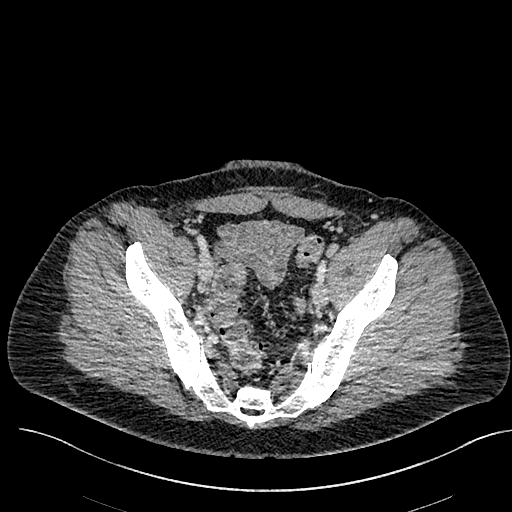
[im 205/487  soft-tissue]
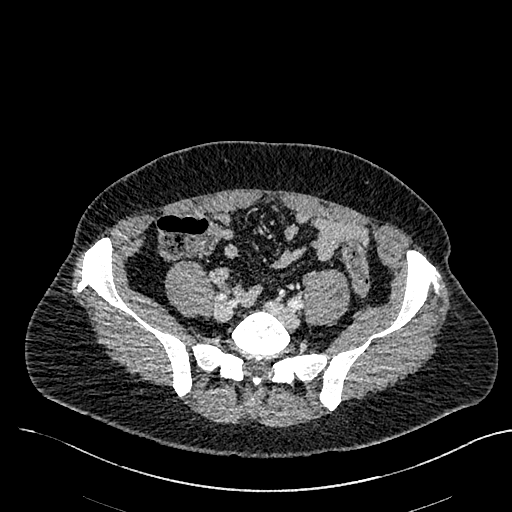
[im 256/487  soft-tissue]
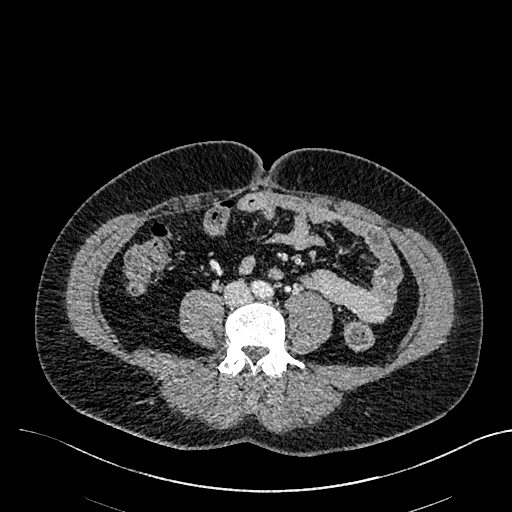
[im 307/487  soft-tissue]
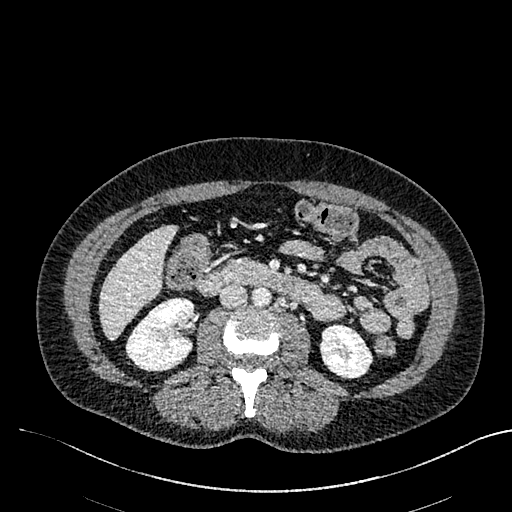
[im 359/487  soft-tissue]
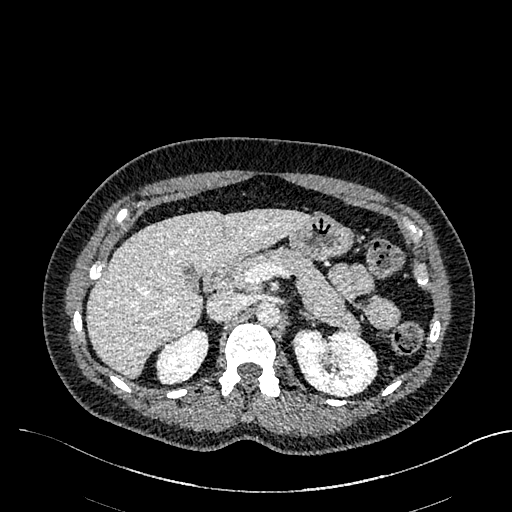
[im 384/487  lung]
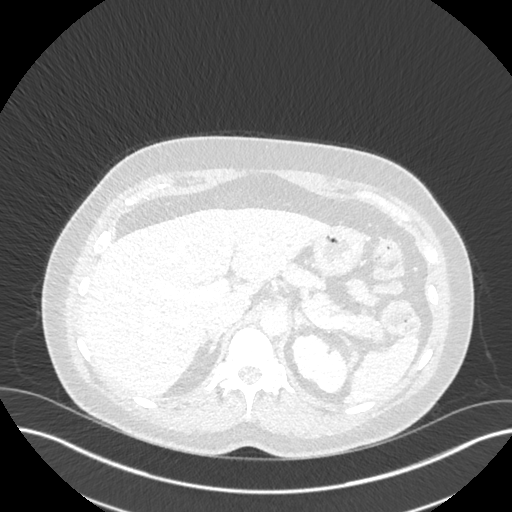
[im 410/487  soft-tissue]
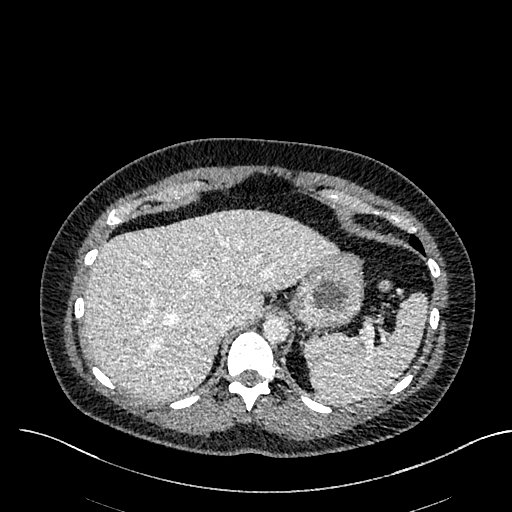
[im 410/487  lung]
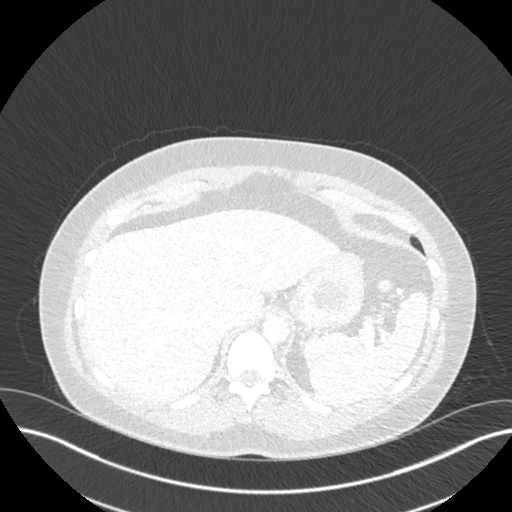
[im 435/487  lung]
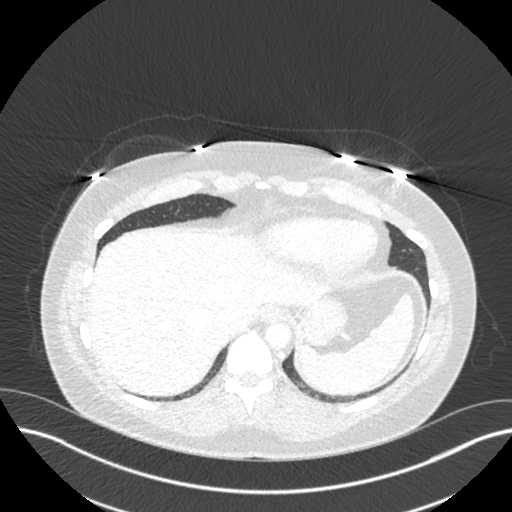
[im 461/487  soft-tissue]
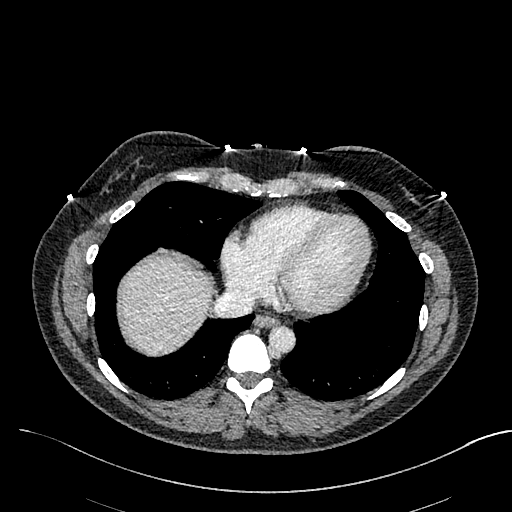
[im 461/487  lung]
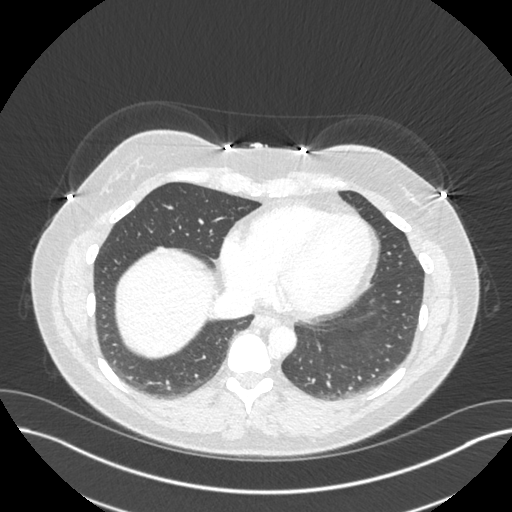
[im 461/487  bone]
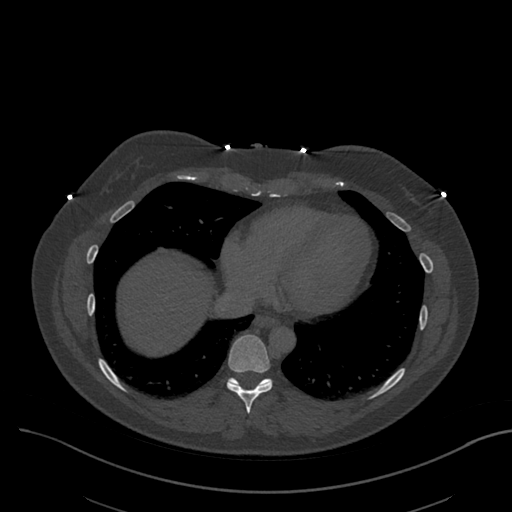

[13 of 46 positions shown; findings below may reference images not displayed]

RADIATION DOSE REDUCTION: This exam was performed according to the
departmental dose-optimization program which includes automated
exposure control, adjustment of the mA and/or kV according to
patient size and/or use of iterative reconstruction technique.

CONTRAST:  75mL [EU] IOPAMIDOL ([EU]) INJECTION 76%
FINDINGS: VASCULAR

Aorta: Minimal calcified plaque in the infrarenal segment. No
aneurysm, dissection, or stenosis.

Celiac: Patent without evidence of aneurysm, dissection, vasculitis
or significant stenosis.

SMA: Patent without evidence of aneurysm, dissection, vasculitis or
significant stenosis. Replaced right hepatic arterial supply, an
anatomic variant.

Renals: Single right, widely patent proximally. There is a subtle
beaded appearance of the distal main right renal artery extending
into the posterior division, with suggestion of thin intraluminal
awebs, seen best on the coronal reconstructions.

Single left, widely patent proximally, also with a subtle beaded
appearance and thin intraluminal webs suggested particularly on the
coronal reconstructions in the mid and distal part of the main renal
artery; distal branches unremarkable.

IMA: Patent without evidence of aneurysm, dissection, vasculitis or
significant stenosis.

Inflow: Bilateral common iliac arteries unremarkable.

Proximal Outflow: Unremarkable, included only on the venous phase.

Veins: Patent hepatic veins, portal vein, bilateral renal veins. No
venous pathology evident.

Review of the MIP images confirms the above findings.

NON-VASCULAR

Lower chest: ASD occluder device partially visualized. No pleural or
pericardial effusion.

Hepatobiliary: No focal liver abnormality is seen. No gallstones,
gallbladder wall thickening, or biliary dilatation.

Pancreas: Unremarkable. No pancreatic ductal dilatation or
surrounding inflammatory changes.

Spleen: Normal in size without focal abnormality.

Adrenals/Urinary Tract: Adrenal glands are unremarkable. Kidneys are
normal, without renal calculi, focal lesion, or hydronephrosis.
Bladder is unremarkable.

Stomach/Bowel: Stomach is within normal limits. Appendix not
identified. No evidence of bowel wall thickening, distention, or
inflammatory changes.

Lymphatic: No abdominal or pelvic adenopathy.

Reproductive: Status post hysterectomy. No adnexal masses.

Other: No ascites.  No free air.

Musculoskeletal: No acute or significant osseous findings.
IMPRESSION: Single renal arteries bilaterally, both with suggestion of
fibromuscular dysplasia as detailed above. If renovascular
hypertension is a clinical concern, consider catheter angiography
for further characterization, as these lesions can have durable
response to balloon angioplasty if hemodynamically significant
stenosis is demonstrated.

## 2021-07-07 MED ORDER — IOPAMIDOL (ISOVUE-370) INJECTION 76%
75.0000 mL | Freq: Once | INTRAVENOUS | Status: AC | PRN
  Administered 2021-07-07: 75 mL via INTRAVENOUS

## 2021-07-08 ENCOUNTER — Other Ambulatory Visit: Payer: Self-pay | Admitting: Family Medicine

## 2021-07-08 DIAGNOSIS — M542 Cervicalgia: Secondary | ICD-10-CM

## 2021-07-08 NOTE — Progress Notes (Deleted)
{  Select_TRH_Note:[id number]} 

## 2021-07-13 ENCOUNTER — Ambulatory Visit
Admission: RE | Admit: 2021-07-13 | Discharge: 2021-07-13 | Disposition: A | Discharge status: Home / Self Care | Payer: 59 | Source: Ambulatory Visit | Attending: Family Medicine | Admitting: Family Medicine

## 2021-07-13 ENCOUNTER — Encounter: Payer: Self-pay | Admitting: Vascular Surgery

## 2021-07-13 ENCOUNTER — Ambulatory Visit (INDEPENDENT_AMBULATORY_CARE_PROVIDER_SITE_OTHER): Payer: 59 | Admitting: Vascular Surgery

## 2021-07-13 ENCOUNTER — Other Ambulatory Visit: Payer: Self-pay

## 2021-07-13 VITALS — BP 136/83 | HR 68 | Temp 98.6°F | Resp 20 | Ht 68.0 in

## 2021-07-13 DIAGNOSIS — I773 Arterial fibromuscular dysplasia: Secondary | ICD-10-CM

## 2021-07-13 DIAGNOSIS — M542 Cervicalgia: Secondary | ICD-10-CM

## 2021-07-13 NOTE — Progress Notes (Signed)
[facility] [place] ? 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  The patient has poorly controlled hypertension.  I explained the risks, benefits, and alternatives of renal angiography with angioplasty of the renal arteries.  She is amenable to this, we will proceed with this as soon as the Cath Lab schedule allows. ? ?CHIEF COMPLAINT: Flank pain ? ?HISTORY OF PRESENT ILLNESS: ?Christie Cain is a 46 y.o. 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  The patient has had a difficult course over the past several months.  She has been gaining weight, she has had persistent right flank pain.  She has hypertension.  She is frustrated by lack of diagnosis.  Her primary care physician, Dr. Konrad Dolores performed a renal duplex, which suggested fibromuscular dysplasia.  This was followed up by CT angiogram which confirmed the diagnosis.  Counseled the patient extensively on the nature of fibromuscular dysplasia, and the rationale for intervention if renovascular hypertension is considered. ? ?Past Medical History:  ?Diagnosis Date  ? ADHD   ? History of hip surgery   ? Hyperlipidemia   ? Hypertension   ? Kidney stones   ? Sepsis (HCC)   ? Stroke [facility])   ? ? ?Past Surgical History:  ?Procedure Laterality Date  ? ANKLE SURGERY    ? CARDIAC SURGERY    ? CESAREAN SECTION    ? HIP SURGERY    ? kidney stone removal    ? KNEE SURGERY    ? LITHOTRIPSY    ? SHOULDER SURGERY    ? ? ?Family History  ?Problem Relation Age of Onset  ? Cancer Mother   ? Stroke Father   ? ? ?Social History  ? ?Socioeconomic History  ? Marital status: Married  ?  Spouse name: Not on file  ? Number of children: Not on file  ? Years of education: Not on file  ? Highest education level: Not on file  ?Occupational History  ? Not on file  ?Tobacco Use  ? Smoking status: Never  ? Smokeless tobacco: Never   ?Vaping Use  ? Vaping Use: Never used  ?Substance and Sexual Activity  ? Alcohol use: No  ? Drug use: No  ? Sexual activity: Not on file  ?Other Topics Concern  ? Not on file  ?Social History Narrative  ? Not on file  ? ?Social Determinants of Health  ? ?Financial Resource Strain: Not on file  ?Food Insecurity: Not on file  ?Transportation Needs: Not on file  ?Physical Activity: Not on file  ?Stress: Not on file  ?Social Connections: Not on file  ?Intimate Partner Violence: Not on file  ? ? ?Allergies  ?Allergen Reactions  ? Celecoxib Anaphylaxis  ?  Pt states she can take naproxen (per Delice Bison, Charity fundraiser) ?Pt states she can take naproxen (per Delice Bison, Charity fundraiser) ?  ? Hydralazine Rash and Shortness Of Breath  ? Morphine And Related Shortness Of Breath  ?  Pt states she can take hydrocodone (per Delice Bison, RN)- per MD patient states Morphine allergy listed is not accurate and she wants it removed ?Pt states she can take hydrocodone (per Delice Bison, Charity fundraiser) ?  ? Doxycycline Hives and Rash  ? Ketorolac Tromethamine Nausea And Vomiting  ? Tramadol Nausea And Vomiting  ?  CAUSED STOMACH BLEED ?  ? Piperacillin Sod-Tazobactam So Hives  ?  2/16 pt broke out into hives in  the ED while on Zosyn  ? Sulfa Antibiotics   ? ? ?Current Outpatient Medications  ?Medication Sig Dispense Refill  ? amoxicillin-clavulanate (AUGMENTIN) 875-125 MG tablet Take 1 tablet by mouth 2 (two) times daily as needed.    ? aspirin EC 81 MG tablet Take 81 mg by mouth daily. Swallow whole.    ? losartan (COZAAR) 100 MG tablet Take 100 mg by mouth daily.    ? amphetamine-dextroamphetamine (ADDERALL) 20 MG tablet Take 20 mg by mouth daily. (Patient not taking: Reported on 07/13/2021)    ? ?No current facility-administered medications for this visit.  ? ? ?PHYSICAL EXAM ?Vitals:  ? 07/13/21 0955  ?BP: 136/83  ?Pulse: 68  ?Resp: 20  ?Temp: 98.6 ?F (37 ?C)  ?SpO2: 96%  ?Height: 5\' 8"  (1.727 m)  ? ? ?Constitutional: Well-appearing woman in no acute distress. ?Cardiac: regular rate and rhythm.   ?Respiratory:  unlabored. ?Abdominal:  soft, non-tender, non-distended.  ? ?PERTINENT LABORATORY AND RADIOLOGIC DATA ?CT angiogram personally reviewed. ? ?Subtle stringing bead changes in the renal arteries bilaterally consistent with fibromuscular dysplasia.  Best seen in the coronal projections, as noted by the reading radiologist. ? ? . Rande Brunt, MD ?[facility] [place] ?Office Phone Number: [phone] ?07/13/2021 5:06 PM ? ?Total time spent on preparing this encounter including chart review, data review, collecting history, examining the patient, coordinating care for this new patient, 60 minutes. ? ?Portions of this report may have been transcribed using voice recognition software.  Every effort has been made to ensure accuracy; however, inadvertent computerized transcription errors may still be present. ? ? ? ?

## 2021-07-13 NOTE — H&P (View-Only) (Signed)
[facility] [place] ? 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  The patient has poorly controlled hypertension.  I explained the risks, benefits, and alternatives of renal angiography with angioplasty of the renal arteries.  She is amenable to this, we will proceed with this as soon as the Cath Lab schedule allows. ? ?CHIEF COMPLAINT: Flank pain ? ?HISTORY OF PRESENT ILLNESS: ?Christie Cain is a 46 y.o. 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  The patient has had a difficult course over the past several months.  She has been gaining weight, she has had persistent right flank pain.  She has hypertension.  She is frustrated by lack of diagnosis.  Her primary care physician, Dr. Konrad Dolores performed a renal duplex, which suggested fibromuscular dysplasia.  This was followed up by CT angiogram which confirmed the diagnosis.  Counseled the patient extensively on the nature of fibromuscular dysplasia, and the rationale for intervention if renovascular hypertension is considered. ? ?Past Medical History:  ?Diagnosis Date  ? ADHD   ? History of hip surgery   ? Hyperlipidemia   ? Hypertension   ? Kidney stones   ? Sepsis (HCC)   ? Stroke [facility])   ? ? ?Past Surgical History:  ?Procedure Laterality Date  ? ANKLE SURGERY    ? CARDIAC SURGERY    ? CESAREAN SECTION    ? HIP SURGERY    ? kidney stone removal    ? KNEE SURGERY    ? LITHOTRIPSY    ? SHOULDER SURGERY    ? ? ?Family History  ?Problem Relation Age of Onset  ? Cancer Mother   ? Stroke Father   ? ? ?Social History  ? ?Socioeconomic History  ? Marital status: Married  ?  Spouse name: Not on file  ? Number of children: Not on file  ? Years of education: Not on file  ? Highest education level: Not on file  ?Occupational History  ? Not on file  ?Tobacco Use  ? Smoking status: Never  ? Smokeless tobacco: Never   ?Vaping Use  ? Vaping Use: Never used  ?Substance and Sexual Activity  ? Alcohol use: No  ? Drug use: No  ? Sexual activity: Not on file  ?Other Topics Concern  ? Not on file  ?Social History Narrative  ? Not on file  ? ?Social Determinants of Health  ? ?Financial Resource Strain: Not on file  ?Food Insecurity: Not on file  ?Transportation Needs: Not on file  ?Physical Activity: Not on file  ?Stress: Not on file  ?Social Connections: Not on file  ?Intimate Partner Violence: Not on file  ? ? ?Allergies  ?Allergen Reactions  ? Celecoxib Anaphylaxis  ?  Pt states she can take naproxen (per Delice Bison, Charity fundraiser) ?Pt states she can take naproxen (per Delice Bison, Charity fundraiser) ?  ? Hydralazine Rash and Shortness Of Breath  ? Morphine And Related Shortness Of Breath  ?  Pt states she can take hydrocodone (per Delice Bison, RN)- per MD patient states Morphine allergy listed is not accurate and she wants it removed ?Pt states she can take hydrocodone (per Delice Bison, Charity fundraiser) ?  ? Doxycycline Hives and Rash  ? Ketorolac Tromethamine Nausea And Vomiting  ? Tramadol Nausea And Vomiting  ?  CAUSED STOMACH BLEED ?  ? Piperacillin Sod-Tazobactam So Hives  ?  2/16 pt broke out into hives in  the ED while on Zosyn  ? Sulfa Antibiotics   ? ? ?Current Outpatient Medications  ?Medication Sig Dispense Refill  ? amoxicillin-clavulanate (AUGMENTIN) 875-125 MG tablet Take 1 tablet by mouth 2 (two) times daily as needed.    ? aspirin EC 81 MG tablet Take 81 mg by mouth daily. Swallow whole.    ? losartan (COZAAR) 100 MG tablet Take 100 mg by mouth daily.    ? amphetamine-dextroamphetamine (ADDERALL) 20 MG tablet Take 20 mg by mouth daily. (Patient not taking: Reported on 07/13/2021)    ? ?No current facility-administered medications for this visit.  ? ? ?PHYSICAL EXAM ?Vitals:  ? 07/13/21 0955  ?BP: 136/83  ?Pulse: 68  ?Resp: 20  ?Temp: 98.6 ?F (37 ?C)  ?SpO2: 96%  ?Height: 5\' 8"  (1.727 m)  ? ? ?Constitutional: Well-appearing woman in no acute distress. ?Cardiac: regular rate and rhythm.   ?Respiratory:  unlabored. ?Abdominal:  soft, non-tender, non-distended.  ? ?PERTINENT LABORATORY AND RADIOLOGIC DATA ?CT angiogram personally reviewed. ? ?Subtle stringing bead changes in the renal arteries bilaterally consistent with fibromuscular dysplasia.  Best seen in the coronal projections, as noted by the reading radiologist. ? ? . Rande Brunt, MD ?[facility] [place] ?Office Phone Number: [phone] ?07/13/2021 5:06 PM ? ?Total time spent on preparing this encounter including chart review, data review, collecting history, examining the patient, coordinating care for this new patient, 60 minutes. ? ?Portions of this report may have been transcribed using voice recognition software.  Every effort has been made to ensure accuracy; however, inadvertent computerized transcription errors may still be present. ? ? ? ?

## 2021-07-23 ENCOUNTER — Ambulatory Visit (HOSPITAL_COMMUNITY)
Admission: RE | Disposition: A | Discharge status: Home / Self Care | Payer: Self-pay | Source: Home / Self Care | Attending: Vascular Surgery

## 2021-07-23 ENCOUNTER — Ambulatory Visit (HOSPITAL_COMMUNITY)
Admission: RE | Admit: 2021-07-23 | Discharge: 2021-07-23 | Disposition: A | Discharge status: Home / Self Care | Payer: 59 | Attending: Vascular Surgery | Admitting: Vascular Surgery

## 2021-07-23 ENCOUNTER — Other Ambulatory Visit: Payer: Self-pay

## 2021-07-23 DIAGNOSIS — I10 Essential (primary) hypertension: Secondary | ICD-10-CM | POA: Insufficient documentation

## 2021-07-23 DIAGNOSIS — I773 Arterial fibromuscular dysplasia: Secondary | ICD-10-CM | POA: Insufficient documentation

## 2021-07-23 DIAGNOSIS — I701 Atherosclerosis of renal artery: Secondary | ICD-10-CM

## 2021-07-23 HISTORY — PX: RENAL ANGIOGRAPHY: CATH118260

## 2021-07-23 LAB — POCT I-STAT, CHEM 8
BUN: 12 mg/dL (ref 6–20)
Calcium, Ion: 1.24 mmol/L (ref 1.15–1.40)
Chloride: 100 mmol/L (ref 98–111)
Creatinine, Ser: 0.7 mg/dL (ref 0.44–1.00)
Glucose, Bld: 97 mg/dL (ref 70–99)
HCT: 46 % (ref 36.0–46.0)
Hemoglobin: 15.6 g/dL — ABNORMAL HIGH (ref 12.0–15.0)
Potassium: 4.4 mmol/L (ref 3.5–5.1)
Sodium: 138 mmol/L (ref 135–145)
TCO2: 26 mmol/L (ref 22–32)

## 2021-07-23 SURGERY — RENAL ANGIOGRAPHY
Anesthesia: LOCAL | Laterality: Bilateral

## 2021-07-23 MED ORDER — LIDOCAINE HCL (PF) 1 % IJ SOLN
INTRAMUSCULAR | Status: AC
  Filled 2021-07-23: qty 30

## 2021-07-23 MED ORDER — MIDAZOLAM HCL 2 MG/2ML IJ SOLN
INTRAMUSCULAR | Status: AC
  Filled 2021-07-23: qty 2

## 2021-07-23 MED ORDER — HEPARIN (PORCINE) IN NACL 1000-0.9 UT/500ML-% IV SOLN
INTRAVENOUS | Status: AC
  Filled 2021-07-23: qty 1000

## 2021-07-23 MED ORDER — FENTANYL CITRATE (PF) 100 MCG/2ML IJ SOLN
INTRAMUSCULAR | Status: AC
  Filled 2021-07-23: qty 2

## 2021-07-23 MED ORDER — LIDOCAINE HCL (PF) 1 % IJ SOLN
INTRAMUSCULAR | Status: DC | PRN
  Administered 2021-07-23: 5 mL
  Administered 2021-07-23: 12 mL

## 2021-07-23 MED ORDER — HEPARIN (PORCINE) IN NACL 1000-0.9 UT/500ML-% IV SOLN
INTRAVENOUS | Status: DC | PRN
  Administered 2021-07-23 (×2): 500 mL

## 2021-07-23 MED ORDER — SODIUM CHLORIDE 0.9 % IV SOLN: INTRAVENOUS | Status: DC

## 2021-07-23 MED ORDER — MIDAZOLAM HCL 2 MG/2ML IJ SOLN
INTRAMUSCULAR | Status: DC | PRN
  Administered 2021-07-23 (×2): 1 mg via INTRAVENOUS

## 2021-07-23 MED ORDER — SODIUM CHLORIDE 0.9% FLUSH: 3.0000 mL | Freq: Two times a day (BID) | INTRAVENOUS | Status: DC

## 2021-07-23 MED ORDER — LABETALOL HCL 5 MG/ML IV SOLN: 10.0000 mg | INTRAVENOUS | Status: DC | PRN

## 2021-07-23 MED ORDER — SODIUM CHLORIDE 0.9 % IV SOLN: 250.0000 mL | INTRAVENOUS | Status: DC | PRN

## 2021-07-23 MED ORDER — ONDANSETRON HCL 4 MG/2ML IJ SOLN: 4.0000 mg | Freq: Four times a day (QID) | INTRAMUSCULAR | Status: DC | PRN

## 2021-07-23 MED ORDER — IODIXANOL 320 MG/ML IV SOLN
INTRAVENOUS | Status: DC | PRN
  Administered 2021-07-23: 40 mL

## 2021-07-23 MED ORDER — FENTANYL CITRATE (PF) 100 MCG/2ML IJ SOLN
INTRAMUSCULAR | Status: DC | PRN
  Administered 2021-07-23 (×2): 50 ug via INTRAVENOUS

## 2021-07-23 MED ORDER — SODIUM CHLORIDE 0.9% FLUSH: 3.0000 mL | INTRAVENOUS | Status: DC | PRN

## 2021-07-23 MED ORDER — SODIUM CHLORIDE 0.9 % WEIGHT BASED INFUSION: 1.0000 mL/kg/h | INTRAVENOUS | Status: DC

## 2021-07-23 MED ORDER — ACETAMINOPHEN 325 MG PO TABS: 650.0000 mg | ORAL_TABLET | ORAL | Status: DC | PRN

## 2021-07-23 SURGICAL SUPPLY — 13 items
CATH CROSS OVER TEMPO 5F (CATHETERS) ×1 IMPLANT
CATH OMNI FLUSH 5F 65CM (CATHETERS) ×1 IMPLANT
CLOSURE PERCLOSE PROSTYLE (VASCULAR PRODUCTS) ×1 IMPLANT
GLIDEWIRE NITREX 0.018X80X5 (WIRE) ×1
GUIDEWIRE NITREX 0.018X80X5 (WIRE) IMPLANT
KIT MICROPUNCTURE NIT STIFF (SHEATH) ×1 IMPLANT
KIT PV (KITS) ×2 IMPLANT
SHEATH PINNACLE 6F 10CM (SHEATH) ×1 IMPLANT
SHEATH PROBE COVER 6X72 (BAG) ×1 IMPLANT
SYR MEDRAD MARK V 150ML (SYRINGE) ×1 IMPLANT
TRANSDUCER W/STOPCOCK (MISCELLANEOUS) ×2 IMPLANT
TRAY PV CATH (CUSTOM PROCEDURE TRAY) ×2 IMPLANT
WIRE BENTSON .035X145CM (WIRE) ×1 IMPLANT

## 2021-07-23 NOTE — Op Note (Signed)
DATE OF SERVICE: 07/23/2021 ? ?PATIENT:  Christie Cain  46 y.o. female ? ?PRE-OPERATIVE DIAGNOSIS:  fibromuscular dysplasia of bilateral renal arteries ? ?POST-OPERATIVE DIAGNOSIS:  no evidence of fibromuscular dysplasia of bilateral renal arteries ? ?PROCEDURE:   ?1) Ultrasound guided right common femoral artery access ?2) Aortogram ?3) Left renal artery selective angiogram ?4) Right renal artery selective angiogram (65mL total contrast) ?5) Conscious sedation (30 minutes) ? ?SURGEON:  Rande Brunt. Lenell Antu, MD ? ?ASSISTANT: none ? ?ANESTHESIA:   local and IV sedation ? ?ESTIMATED BLOOD LOSS: minimal ? ?LOCAL MEDICATIONS USED:  LIDOCAINE  ? ?COUNTS: confirmed correct. ? ?PATIENT DISPOSITION:  PACU - hemodynamically stable. ?  ?Delay start of Pharmacological VTE agent (>24hrs) due to surgical blood loss or risk of bleeding: no ? ?INDICATION FOR PROCEDURE: Christie Cain is a 46 y.o. female with hypertension. Workup including CT angiogram concerning for fibromuscular dysplasia. After careful discussion of risks, benefits, and alternatives the patient was offered conventional angiogram. The patient understood and wished to proceed. ? ?OPERATIVE FINDINGS:  ?Terminal aorta and iliac arteries: ?Normal paravisceral, pararenal, infrarenal aorta ?Normal iliac arteries ?Bilateral renal arteries without stenosis or "string of pearls" appearance typical of fibromuscular dysplasia ?No pressure gradient across aorta --> renal arteries bilaterally ?Selective angiograms confirm aortogram pictures. ? ?DESCRIPTION OF PROCEDURE: After identification of the patient in the pre-operative holding area, the patient was transferred to the operating room. The patient was positioned supine on the operating room table. Anesthesia was induced. The groins was prepped and draped in standard fashion. A surgical pause was performed confirming correct patient, procedure, and operative location. ? ?The right groin was anesthetized with subcutaneous  injection of 1% lidocaine. Using ultrasound guidance, the right common femoral artery was accessed with micropuncture technique. Fluoroscopy was used to confirm cannulation over the femoral head. The 32F sheath was upsized to 27F.  ? ?A Benson wire was advanced into the distal aorta. Over the wire an omni flush catheter was advanced to the level of L2. Aortogram was performed - see above for details.  ? ?The left renal artery was selected with a IMA catheter.  A pressure gradient was measured.  A magnified angiogram was performed of the left renal artery.  See above for details. ? ?The right renal artery was selected with an IMA catheter.  A pressure gradient was measured.  A magnified angiogram was performed the right renal artery.  See above for details. ? ?A perclose device was used to close the arteriotomy. Hemostasis was excellent upon completion. ? ?Conscious sedation was administered with the use of IV fentanyl and midazolam under continuous physician and nurse monitoring.  Heart rate, blood pressure, and oxygen saturation were continuously monitored.  Total sedation time was 30 minutes ? ?Upon completion of the case instrument and sharps counts were confirmed correct. The patient was transferred to the PACU in good condition. I was present for all portions of the procedure. ? ?PLAN: No evidence of fibromuscular dysplasia of the bilateral renal arteries. ? ?Rande Brunt. Lenell Antu, MD ?[facility] [place] ?Office Phone Number: [phone] ?07/23/2021 11:00 AM ? ?

## 2021-07-23 NOTE — Interval H&P Note (Signed)
History and Physical Interval Note: ? ?07/23/2021 ?10:29 AM ? ?Christie Cain  has presented today for surgery, with the diagnosis of fmd.  The various methods of treatment have been discussed with the patient and family. After consideration of risks, benefits and other options for treatment, the patient has consented to  Procedure(s): ?RENAL ANGIOGRAPHY (N/A) as a surgical intervention.  The patient's history has been reviewed, patient examined, no change in status, stable for surgery.  I have reviewed the patient's chart and labs.  Questions were answered to the patient's satisfaction.   ? ? ?Leonie Douglas ? ? ?

## 2021-07-23 NOTE — Progress Notes (Signed)
Patient and sister was given discharge instructions. Both verbalized understanding. 

## 2021-07-26 ENCOUNTER — Encounter (HOSPITAL_COMMUNITY): Payer: Self-pay | Admitting: Vascular Surgery

## 2021-07-26 MED FILL — Lidocaine HCl Local Preservative Free (PF) Inj 1%: INTRAMUSCULAR | Qty: 30 | Status: AC

## 2021-12-28 ENCOUNTER — Telehealth: Payer: Self-pay | Admitting: Hematology and Oncology

## 2021-12-28 NOTE — Telephone Encounter (Signed)
Scheduled appt per 10/17 referral. Pt is aware of appt date and time. Pt is aware to arrive 15 mins prior to appt time and to bring and updated insurance card. Pt is aware of appt location.   

## 2022-01-14 ENCOUNTER — Inpatient Hospital Stay: Payer: 59

## 2022-01-14 ENCOUNTER — Inpatient Hospital Stay: Payer: 59 | Attending: Hematology and Oncology | Admitting: Hematology and Oncology

## 2022-01-14 ENCOUNTER — Other Ambulatory Visit: Payer: Self-pay

## 2022-01-14 VITALS — BP 150/87 | HR 72 | Temp 98.6°F | Resp 15 | Wt 201.4 lb

## 2022-01-14 DIAGNOSIS — D6851 Activated protein C resistance: Secondary | ICD-10-CM | POA: Insufficient documentation

## 2022-01-14 NOTE — Progress Notes (Signed)
[facility] Telephone:([phone]) [phone]   Fax:([phone]  INITIAL CONSULT NOTE  Patient Care Team: Ozella Rocks, MD as PCP - General (Family Medicine)  Hematological/Oncological History # Heterozygous Factor V Leiden 05/27/2016: Hypercoagulation workup during CVA evaluation showed a heterozygous factor V mutation.   12/28/2021: patient evaluated at [facility] for inflammatory polyarthropathy. Reported history of FVL, recommended referral to hematology 01/14/2022: establish care with Dr. Leonides Schanz   CHIEF COMPLAINTS/PURPOSE OF CONSULTATION:  "Reported History of Factor V Leiden "  HISTORY OF PRESENTING ILLNESS:  Christie Cain 46 y.o. female with medical history significant for CVA, HTN, HLD, and kidney stones who presents for evaluation of a reported history of Factor V Leiden.   On review of the previous records Christie Cain was found to have a heterozygous factor V Leiden mutation during a hypercoagulation workup for a stroke on 05/27/2016.  She was recently evaluated at [facility] for inflammatory polyarthropathy.  When she informed them of her history of factor V Leiden they recommended a referral to hematology for further evaluation.  On exam today Christie Cain reports that "I have a lot going on".  She wanted to make sure that we were doing everything necessary to prevent the development of clots.  She reports that she has a rheumatology visit coming up next week.  She has had 2 knee surgeries and unfortunately suffers from a connective tissue disorder.  She does that she does have an occasional leg cramp when not doing anything in particular.  She reports that she was noted to have a PFO when her heart back in February 2017.  She notes that she does suffer from joint pain and underwent hip surgery last year because "the right one was acting up".  She notes that she has had no other signs or symptoms concerning for  venous thromboembolism including leg swelling, leg pain, shortness of breath, or chest pain.  On further discussion she reports that she has 4 children all of whom are healthy.  Her father passed away at age 48.  She reports her mother had breast cancer and there was breast cancer in both grandmothers.  She reports that she has undergone a hysterectomy with removal of 1 ovary.  She has gone through menopause.  She reports that she did smoke but quit 1.5 years ago.  She notes that she used to have a cigarette with her wine.  She does not drink any alcohol now.  She currently works as an Occupational hygienist.  She currently denies any fevers, chills, sweats, nausea, vomiting or diarrhea.  A full 10 point ROS was otherwise negative.  MEDICAL HISTORY:  Past Medical History:  Diagnosis Date   ADHD    History of hip surgery    Hyperlipidemia    Hypertension    Kidney stones    Sepsis (HCC)    Stroke [facility])     SURGICAL HISTORY: Past Surgical History:  Procedure Laterality Date   ANKLE SURGERY     CARDIAC SURGERY     CESAREAN SECTION     HIP SURGERY     kidney stone removal     KNEE SURGERY     LITHOTRIPSY     RENAL ANGIOGRAPHY Bilateral 07/23/2021   Procedure: RENAL ANGIOGRAPHY;  Surgeon: Leonie Douglas, MD;  Location: [facility] INVASIVE CV LAB;  Service: Cardiovascular;  Laterality: Bilateral;   SHOULDER SURGERY      SOCIAL HISTORY: Social History  Socioeconomic History   Marital status: Married    Spouse name: Not on file   Number of children: Not on file   Years of education: Not on file   Highest education level: Not on file  Occupational History   Not on file  Tobacco Use   Smoking status: Never   Smokeless tobacco: Never  Vaping Use   Vaping Use: Never used  Substance and Sexual Activity   Alcohol use: No   Drug use: No   Sexual activity: Not on file  Other Topics Concern   Not on file  Social History Narrative   Not on file   Social Determinants of Health    Financial Resource Strain: Not on file  Food Insecurity: Not on file  Transportation Needs: Not on file  Physical Activity: Not on file  Stress: Not on file  Social Connections: Not on file  Intimate Partner Violence: Not on file    FAMILY HISTORY: Family History  Problem Relation Age of Onset   Cancer Mother    Stroke Father     ALLERGIES:  is allergic to celebrex [celecoxib], hydralazine, sulfa antibiotics, doxycycline, toradol [ketorolac tromethamine], tramadol, and zosyn [piperacillin sod-tazobactam so].  MEDICATIONS:  Current Outpatient Medications  Medication Sig Dispense Refill   atorvastatin (LIPITOR) 10 MG tablet Take 10 mg by mouth daily.     FLUoxetine (PROZAC) 20 MG capsule Take 20 mg by mouth at bedtime.     ondansetron (ZOFRAN-ODT) 4 MG disintegrating tablet Take 4 mg by mouth every 8 (eight) hours as needed.     pregabalin (LYRICA) 25 MG capsule Take 25 mg by mouth daily.     albuterol (VENTOLIN HFA) 108 (90 Base) MCG/ACT inhaler Inhale 2 puffs into the lungs every 4 (four) hours as needed.     amphetamine-dextroamphetamine (ADDERALL) 20 MG tablet Take 20 mg by mouth daily.     Ascorbic Acid (SUPER C COMPLEX PO) Take 1 tablet by mouth daily.     aspirin EC 81 MG tablet Take 81 mg by mouth daily. Swallow whole.     losartan (COZAAR) 100 MG tablet Take 100 mg by mouth daily.     Multiple Vitamins-Minerals (MULTIVITAMIN WITH MINERALS) tablet Take 1 tablet by mouth daily.     No current facility-administered medications for this visit.    REVIEW OF SYSTEMS:   Constitutional: ( - ) fevers, ( - )  chills , ( - ) night sweats Eyes: ( - ) blurriness of vision, ( - ) double vision, ( - ) watery eyes Ears, nose, mouth, throat, and face: ( - ) mucositis, ( - ) sore throat Respiratory: ( - ) cough, ( - ) dyspnea, ( - ) wheezes Cardiovascular: ( - ) palpitation, ( - ) chest discomfort, ( - ) lower extremity swelling Gastrointestinal:  ( - ) nausea, ( - ) heartburn, ( -  ) change in bowel habits Skin: ( - ) abnormal skin rashes Lymphatics: ( - ) new lymphadenopathy, ( - ) easy bruising Neurological: ( - ) numbness, ( - ) tingling, ( - ) new weaknesses Behavioral/Psych: ( - ) mood change, ( - ) new changes  All other systems were reviewed with the patient and are negative.  PHYSICAL EXAMINATION:  Vitals:   01/14/22 1327  BP: (!) 150/87  Pulse: 72  Resp: 15  Temp: 98.6 F (37 C)  SpO2: 98%   Filed Weights   01/14/22 1327  Weight: 201 lb 6.4 oz (91.4 kg)  GENERAL: well appearing middle-aged Caucasian female in NAD  SKIN: skin color, texture, turgor are normal, no rashes or significant lesions EYES: conjunctiva are pink and non-injected, sclera clear LUNGS: clear to auscultation and percussion with normal breathing effort HEART: regular rate & rhythm and no murmurs and no lower extremity edema Musculoskeletal: no cyanosis of digits and no clubbing  PSYCH: alert & oriented x 3, fluent speech NEURO: no focal motor/sensory deficits  LABORATORY DATA:  I have reviewed the data as listed    Latest Ref Rng & Units 07/23/2021    8:38 AM  CBC  Hemoglobin 12.0 - 15.0 g/dL 15.6   Hematocrit 36.0 - 46.0 % 46.0        Latest Ref Rng & Units 07/23/2021    8:38 AM  CMP  Glucose 70 - 99 mg/dL 97   BUN 6 - 20 mg/dL 12   Creatinine 0.44 - 1.00 mg/dL 0.70   Sodium 135 - 145 mmol/L 138   Potassium 3.5 - 5.1 mmol/L 4.4   Chloride 98 - 111 mmol/L 100      ASSESSMENT & PLAN Christie Cain 46 y.o. female with medical history significant for CVA, HTN, HLD, and kidney stones who presents for evaluation of a reported history of Factor V Leiden.   After review of the labs, review of the records, and discussion with the patient the patients findings are most consistent with a heterozygous factor V Leiden mutation found in the setting of a hypercoagulation workup for CVA.  # Heterozygous Factor V Leiden --testing confirmed heterozygous mutation status on  05/27/2016 at [facility] --patient does not require any increased or long term anticoagulation for this mutation --ASA 81 mg can be continued per recommendations for stroke, but not required for FVL --FVL mutation is not associated with increased risk of ischemic stroke in adult patient population. (J Stroke Cerebrovasc Dis. 2013 May;22) --no special precautions required for procedures/surgeries in the future due to this mutation.  --no need for routine f/u in our clinic.   No orders of the defined types were placed in this encounter.   All questions were answered. The patient knows to call the clinic with any problems, questions or concerns.  A total of more than 60 minutes were spent on this encounter with face-to-face time and non-face-to-face time, including preparing to see the patient, ordering tests and/or medications, counseling the patient and coordination of care as outlined above.   Ledell Peoples, MD Department of Hematology/Oncology [place] at [facility] Phone: [phone] Pager: [phone] Email: [email or web address]  02/01/2022 3:14 PM

## 2022-02-15 ENCOUNTER — Other Ambulatory Visit: Payer: Self-pay | Admitting: Family Medicine

## 2022-02-15 DIAGNOSIS — M1610 Unilateral primary osteoarthritis, unspecified hip: Secondary | ICD-10-CM

## 2022-02-15 DIAGNOSIS — M154 Erosive (osteo)arthritis: Secondary | ICD-10-CM

## 2022-02-15 NOTE — Progress Notes (Signed)
Acute on chronic R hip pain secondary to progressive arthritis and systemic inflammatory process.  Sees Rheum on 02/16/22 Xray of R hip on 02/05/22 reviewed and showing arthritis.  S/p L femoroplasty by Dr Caswell Corwin in 2022 w/ marked improvement in mobility and pain.  MRI ordered for R hip.

## 2022-02-15 NOTE — Addendum Note (Signed)
Addended by: Ozella Rocks on: 02/15/2022 04:18 PM   Modules accepted: Orders

## 2022-02-22 ENCOUNTER — Ambulatory Visit
Admission: RE | Admit: 2022-02-22 | Discharge: 2022-02-22 | Disposition: A | Discharge status: Home / Self Care | Payer: 59 | Source: Ambulatory Visit | Attending: Family Medicine | Admitting: Family Medicine

## 2022-02-22 DIAGNOSIS — M1610 Unilateral primary osteoarthritis, unspecified hip: Secondary | ICD-10-CM

## 2022-02-22 DIAGNOSIS — M154 Erosive (osteo)arthritis: Secondary | ICD-10-CM

## 2022-04-04 ENCOUNTER — Encounter: Payer: Self-pay | Admitting: Gastroenterology

## 2022-04-25 ENCOUNTER — Telehealth: Payer: Self-pay

## 2022-04-25 NOTE — Telephone Encounter (Signed)
Attempted to call patient x3 with no answer. VM left. Pt is to call back before 5 pm to have her PV rescheduled otherwise both her PV and colonoscopy will be cancelled.

## 2022-05-11 ENCOUNTER — Encounter: Payer: Self-pay | Admitting: Hematology and Oncology

## 2022-05-13 ENCOUNTER — Encounter: Payer: 59 | Admitting: Gastroenterology

## 2022-05-23 ENCOUNTER — Ambulatory Visit
Admission: RE | Admit: 2022-05-23 | Discharge: 2022-05-23 | Disposition: A | Discharge status: Home / Self Care | Payer: 59 | Source: Ambulatory Visit | Attending: Family Medicine | Admitting: Family Medicine

## 2022-05-23 ENCOUNTER — Other Ambulatory Visit: Payer: Self-pay | Admitting: Family Medicine

## 2022-05-23 DIAGNOSIS — G8929 Other chronic pain: Secondary | ICD-10-CM

## 2022-05-23 DIAGNOSIS — Z9889 Other specified postprocedural states: Secondary | ICD-10-CM

## 2022-05-23 NOTE — Progress Notes (Signed)
medial ankle pain inferior to medial malleolus. h/o ankle surgery/fusion.  Chronic ankle pain w /3 wks of worsening.  Xray ordered

## 2023-07-01 ENCOUNTER — Encounter (HOSPITAL_COMMUNITY): Payer: Self-pay

## 2023-07-01 ENCOUNTER — Other Ambulatory Visit: Payer: Self-pay

## 2023-07-01 ENCOUNTER — Emergency Department (HOSPITAL_COMMUNITY)
Admission: EM | Admit: 2023-07-01 | Discharge: 2023-07-03 | Disposition: A | Discharge status: Other Acute Inpatient Hospital | Attending: Emergency Medicine | Admitting: Emergency Medicine

## 2023-07-01 DIAGNOSIS — Z8673 Personal history of transient ischemic attack (TIA), and cerebral infarction without residual deficits: Secondary | ICD-10-CM | POA: Insufficient documentation

## 2023-07-01 DIAGNOSIS — F22 Delusional disorders: Secondary | ICD-10-CM | POA: Diagnosis present

## 2023-07-01 DIAGNOSIS — Z79899 Other long term (current) drug therapy: Secondary | ICD-10-CM | POA: Diagnosis not present

## 2023-07-01 DIAGNOSIS — F419 Anxiety disorder, unspecified: Secondary | ICD-10-CM | POA: Diagnosis present

## 2023-07-01 DIAGNOSIS — R739 Hyperglycemia, unspecified: Secondary | ICD-10-CM | POA: Insufficient documentation

## 2023-07-01 DIAGNOSIS — Z7982 Long term (current) use of aspirin: Secondary | ICD-10-CM | POA: Diagnosis not present

## 2023-07-01 DIAGNOSIS — I10 Essential (primary) hypertension: Secondary | ICD-10-CM | POA: Diagnosis not present

## 2023-07-01 DIAGNOSIS — F32A Depression, unspecified: Secondary | ICD-10-CM

## 2023-07-01 LAB — COMPREHENSIVE METABOLIC PANEL WITH GFR
ALT: 23 U/L (ref 0–44)
AST: 27 U/L (ref 15–41)
Albumin: 4 g/dL (ref 3.5–5.0)
Alkaline Phosphatase: 93 U/L (ref 38–126)
Anion gap: 11 (ref 5–15)
BUN: 11 mg/dL (ref 6–20)
CO2: 21 mmol/L — ABNORMAL LOW (ref 22–32)
Calcium: 8.9 mg/dL (ref 8.9–10.3)
Chloride: 109 mmol/L (ref 98–111)
Creatinine, Ser: 0.77 mg/dL (ref 0.44–1.00)
GFR, Estimated: >60 mL/min (ref >60)
Glucose, Bld: 134 mg/dL — ABNORMAL HIGH (ref 70–99)
Potassium: 3.6 mmol/L (ref 3.5–5.1)
Sodium: 141 mmol/L (ref 135–145)
Total Bilirubin: 0.8 mg/dL (ref 0.0–1.2)
Total Protein: 6.7 g/dL (ref 6.5–8.1)

## 2023-07-01 LAB — CBC WITH DIFFERENTIAL/PLATELET
Abs Immature Granulocytes: 0.02 10*3/uL (ref 0.00–0.07)
Basophils Absolute: 0 10*3/uL (ref 0.0–0.1)
Basophils Relative: 1 %
Eosinophils Absolute: 0.3 10*3/uL (ref 0.0–0.5)
Eosinophils Relative: 4 %
HCT: 45.8 % (ref 36.0–46.0)
Hemoglobin: 15.4 g/dL — ABNORMAL HIGH (ref 12.0–15.0)
Immature Granulocytes: 0 %
Lymphocytes Relative: 42 %
Lymphs Abs: 2.6 10*3/uL (ref 0.7–4.0)
MCH: 31.4 pg (ref 26.0–34.0)
MCHC: 33.6 g/dL (ref 30.0–36.0)
MCV: 93.5 fL (ref 80.0–100.0)
Monocytes Absolute: 0.3 10*3/uL (ref 0.1–1.0)
Monocytes Relative: 4 %
Neutro Abs: 3 10*3/uL (ref 1.7–7.7)
Neutrophils Relative %: 49 %
Platelets: 227 10*3/uL (ref 150–400)
RBC: 4.9 MIL/uL (ref 3.87–5.11)
RDW: 12.4 % (ref 11.5–15.5)
WBC: 6.2 10*3/uL (ref 4.0–10.5)
nRBC: 0 % (ref 0.0–0.2)

## 2023-07-01 LAB — ACETAMINOPHEN LEVEL: Acetaminophen (Tylenol), Serum: <10 ug/mL — ABNORMAL LOW (ref 10–30)

## 2023-07-01 LAB — RAPID URINE DRUG SCREEN, HOSP PERFORMED
Amphetamines: NOT DETECTED
Barbiturates: NOT DETECTED
Benzodiazepines: POSITIVE — AB
Cocaine: NOT DETECTED
Opiates: NOT DETECTED
Tetrahydrocannabinol: NOT DETECTED

## 2023-07-01 LAB — ETHANOL: Alcohol, Ethyl (B): <10 mg/dL (ref <10)

## 2023-07-01 LAB — SALICYLATE LEVEL: Salicylate Lvl: <7 mg/dL — ABNORMAL LOW (ref 7.0–30.0)

## 2023-07-01 MED ORDER — ACETAMINOPHEN 325 MG PO TABS
975.0000 mg | ORAL_TABLET | Freq: Four times a day (QID) | ORAL | Status: DC | PRN
  Administered 2023-07-01 – 2023-07-02 (×4): 975 mg via ORAL
  Administered 2023-07-03: 650 mg via ORAL
  Filled 2023-07-01 (×4): qty 3

## 2023-07-01 MED ORDER — RISPERIDONE 1 MG PO TABS
1.0000 mg | ORAL_TABLET | Freq: Every day | ORAL | Status: DC
  Administered 2023-07-01 – 2023-07-02 (×2): 1 mg via ORAL
  Filled 2023-07-01 (×2): qty 1

## 2023-07-01 MED ORDER — FLUOXETINE HCL 10 MG PO CAPS
10.0000 mg | ORAL_CAPSULE | Freq: Every day | ORAL | Status: DC
  Administered 2023-07-01 – 2023-07-03 (×3): 10 mg via ORAL
  Filled 2023-07-01 (×3): qty 1

## 2023-07-01 NOTE — ED Notes (Signed)
One belongings bag placed in locker #27

## 2023-07-01 NOTE — ED Notes (Signed)
 Pt has one belongings bag placed in cabinet over 19-22 assignment
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
## 2023-07-02 MED ORDER — LOSARTAN POTASSIUM 50 MG PO TABS
50.0000 mg | ORAL_TABLET | Freq: Every day | ORAL | Status: DC
  Administered 2023-07-02 – 2023-07-03 (×2): 50 mg via ORAL
  Filled 2023-07-02 (×2): qty 1

## 2023-07-02 MED ORDER — OXYCODONE HCL 5 MG PO TABS
5.0000 mg | ORAL_TABLET | ORAL | Status: AC
  Administered 2023-07-02: 5 mg via ORAL
  Filled 2023-07-02: qty 1

## 2023-07-02 NOTE — ED Notes (Signed)
 Lying in bed in nad at this time

## 2023-07-02 NOTE — ED Notes (Signed)
 In nad, eating dinner at this time

## 2023-07-02 NOTE — ED Notes (Signed)
 Pt watching tv, in nad at this time. States she normally takes BP meds cozaar  50mg  at night. I will alert provider

## 2023-07-02 NOTE — Progress Notes (Signed)
 Patient has been denied by [facility] due to no appropriate beds available. Patient meets BH inpatient criteria per Arvell Latin, NP. Patient has been faxed out to the following facilities:   [facility] [address] [place] [place] [address] [phone]  [facility] [address] [place] [address] [phone]  [facility] Center-Adult [address] Johnella Naas [place] [address] [phone]  [facility] [address] [place] [address] [phone]  [facility] [address] [place] [address] [phone]  [facility] [place] [address] [place], [place] [address] [phone]  [facility] Patient Placement [facility], [place] [phone]  [facility] [address] [place] [address] [phone]  [facility] [address] [place] [address] [phone]  [facility] [address]ina Nicks [place] [address] [phone]  [facility] [address] [place] [address] [phone]  [facility] EFAX [address], [place] [phone]  [facility] [address], [place] [address] [phone]  [facility] [address] [place] [address] [phone]  [facility] [address] [place], [place] [address] [phone]  [facility] [address] [place] [address] [phone]  [facility] [address] [place] [address] [phone]  [facility] [address] [place] [address] [phone]  [facility] [address], [place] [address] [phone]  [facility] [address] [place] [address] [phone]   Phares Brasher, MSW, LCSW-A  10:17 AM 07/02/2023

## 2023-07-02 NOTE — ED Provider Notes (Signed)
 Emergency Medicine Observation Re-evaluation Note  Christie Cain is a 48 y.o. female, seen on rounds today.  Pt initially presented to the ED for complaints of Psychiatric Evaluation Currently, the patient is resting.  Physical Exam  BP (!) 144/89 (BP Location: Left Arm)   Pulse 69   Temp 97.6 F (36.4 C) (Oral)   Resp 16   Ht 1.727 m (5\' 8" )   Wt 83.9 kg   SpO2 98%   BMI 28.13 kg/m  Physical Exam General: wdwn Cardiac: rrr Lungs: normal hr Psych: resting  ED Course / MDM  EKG:EKG Interpretation Date/Time:  Saturday July 01 2023 12:00:18 EDT Ventricular Rate:  82 PR Interval:  136 QRS Duration:  92 QT Interval:  394 QTC Calculation: 460 R Axis:   86  Text Interpretation: Normal sinus rhythm Incomplete right bundle branch block Borderline ECG When compared with ECG of 23-Jul-2021 08:18, PREVIOUS ECG IS PRESENT Confirmed by Shayna Kell [phone]) on 07/01/2023 1:17:52 PM  I have reviewed the labs performed to date as well as medications administered while in observation.  Recent changes in the last 24 hours include psych saw and advised admission.  Plan  Current plan is for admission.    Auston Blush, MD 07/02/23 [phone]

## 2023-07-02 NOTE — ED Notes (Signed)
 Denies pain other than headache, will alert provider. I asked patient if she needs further medication for pain, she told me "I'll take whatever ya got." Pt received tylenol  at approx 0615

## 2023-07-02 NOTE — ED Notes (Signed)
 Pt took a shower accompanied by two techs. In nad, returned to room
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
## 2023-07-02 NOTE — ED Notes (Signed)
 Messaged provider for more pain meds per pt request

## 2023-07-02 NOTE — Consult Note (Signed)
 Patient is accepted at [facility] Psychiatry unit. Patient will be transported tomorrow.  She is now taking Risperidone  and Prozac  for mood/Paranoia and Depression respectively.
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
## 2023-07-02 NOTE — ED Notes (Signed)
 Attempted to give report to [facility], they said to call back tomorrow morning
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
## 2023-07-02 NOTE — ED Notes (Signed)
 Last PM daughter pick up pt house and car keys per pt request.

## 2023-07-02 NOTE — ED Notes (Addendum)
 Call to cancel [facility] bed placement. Pt will be going to [facility] tomorrow morning. Pt can go via safe transport. Accepting MD is Emilie Harden, MD Reporting number is [phone]

## 2023-07-02 NOTE — ED Notes (Signed)
Received report from [place], [place]

## 2023-07-02 NOTE — ED Notes (Signed)
 Pt relaxing In bed appears asleep

## 2023-07-02 NOTE — ED Notes (Addendum)
 Note entered in error

## 2023-07-02 NOTE — ED Notes (Signed)
 In bed in nad, sitter at door for pt safety

## 2023-07-02 NOTE — ED Notes (Signed)
 Pt called nurse to room to ask if her chart was "break the glass/confidential". Pt also wanted to know who was listed on her contact list. Pt expressed that she felt the physician she spoke with earlier in the day had spoke with someone outside of her contact list, and that she felt this way due to the way she was being treated. Nurse informed pt that her daughter was the only person listed as contact.

## 2023-07-02 NOTE — ED Notes (Signed)
 Pt in bed appears asleep at this time, sitter present for patient safety

## 2023-07-02 NOTE — Progress Notes (Signed)
 [facility] LCSW Progress Note   07/02/2023    11:00 AM  Christie Cain   [id number]   Type of Contact and Topic:  Psychiatric Bed Placement   Pt accepted to [facility] Unit      Patient meets inpatient criteria per Arvell Latin, NP  The attending provider will be Dr. Emilie Harden  Call report to [phone]  Kayleen Party Deberardinis, RN @ [facility] ED notified.     Pt scheduled  to arrive at [facility].    Ziyan Schoon, MSW, LCSW-A  11:01 AM 07/02/2023

## 2023-07-02 NOTE — ED Notes (Addendum)
Pt given ice cream per request.  

## 2023-07-03 NOTE — ED Notes (Signed)
 Pt slept throughout the night with minimal interruption, sitter present at bedside for pt's safety.

## 2023-07-03 NOTE — ED Notes (Signed)
  Signed      [facility] LCSW Progress Note   07/02/2023    11:00 AM   Christie Cain    [id number]    Type of Contact and Topic:  Psychiatric Bed Placement    Pt accepted to [facility] Unit       Patient meets inpatient criteria per Arvell Latin, NP   The attending provider will be Dr. Emilie Harden   Call report to [phone]   Kayleen Party Deberardinis, RN @ [facility] ED notified.      Pt scheduled  to arrive at [facility].

## 2023-07-03 NOTE — ED Provider Notes (Signed)
 Emergency Medicine Observation Re-evaluation Note  Christie Cain is a 48 y.o. female, seen on rounds today.  Pt initially presented to the ED for complaints of Psychiatric Evaluation Currently, the patient is awake and alert.  She has been accepted to [facility] for today by Dr. Diann Forth.   Physical Exam  BP 125/74 (BP Location: Right Arm)   Pulse 68   Temp 98.2 F (36.8 C) (Oral)   Resp 18   Ht 5\' 8"  (1.727 m)   Wt 83.9 kg   SpO2 99%   BMI 28.13 kg/m  Physical Exam General: awake and alert Cardiac: rr Lungs: clear Psych: calm  ED Course / MDM  EKG:EKG Interpretation Date/Time:  Saturday July 01 2023 12:00:18 EDT Ventricular Rate:  82 PR Interval:  136 QRS Duration:  92 QT Interval:  394 QTC Calculation: 460 R Axis:   86  Text Interpretation: Normal sinus rhythm Incomplete right bundle branch block Borderline ECG When compared with ECG of 23-Jul-2021 08:18, PREVIOUS ECG IS PRESENT Confirmed by Enslie Kell [phone]) on 07/01/2023 1:17:52 PM  I have reviewed the labs performed to date as well as medications administered while in observation.  Recent changes in the last 24 hours include acceptance to [facility]  Plan  Current plan is for tx to [facility] today.    Sueellen Emery, MD 07/03/23 [phone]
